# Patient Record
Sex: Male | Born: 1945 | Race: White | Hispanic: No
Health system: Midwestern US, Academic
[De-identification: ages and names within clinical notes are randomized; demographics above are authoritative.]

---

## 2017-03-27 ENCOUNTER — Encounter: Admit: 2017-03-27 | Discharge: 2017-03-27 | Payer: MEDICARE

## 2017-03-27 DIAGNOSIS — I4821 Permanent atrial fibrillation: Principal | ICD-10-CM

## 2017-05-08 ENCOUNTER — Encounter: Admit: 2017-05-08 | Discharge: 2017-05-08 | Payer: MEDICARE

## 2017-05-08 MED ORDER — LOSARTAN-HYDROCHLOROTHIAZIDE 50-12.5 MG PO TAB
ORAL_TABLET | Freq: Every day | ORAL | 0 refills | 28.00000 days | Status: AC
Start: 2017-05-08 — End: 2017-12-23

## 2017-05-08 MED ORDER — PRAVASTATIN 80 MG PO TAB
ORAL_TABLET | Freq: Every day | ORAL | 0 refills | 90.00000 days | Status: AC
Start: 2017-05-08 — End: 2017-12-23

## 2017-05-12 ENCOUNTER — Encounter: Admit: 2017-05-12 | Discharge: 2017-05-12 | Payer: MEDICARE

## 2017-05-12 NOTE — Telephone Encounter
Called and discussed overdue INR with patient.  He will have INR drawn this week.

## 2017-05-13 ENCOUNTER — Encounter: Admit: 2017-05-13 | Discharge: 2017-05-13 | Payer: MEDICARE

## 2017-05-13 DIAGNOSIS — I4821 Permanent atrial fibrillation: Principal | ICD-10-CM

## 2017-06-02 ENCOUNTER — Encounter: Admit: 2017-06-02 | Discharge: 2017-06-02 | Payer: MEDICARE

## 2017-06-02 MED ORDER — WARFARIN 5 MG PO TAB
ORAL_TABLET | Freq: Every day | ORAL | 1 refills | 90.00000 days | Status: AC
Start: 2017-06-02 — End: 2018-06-10

## 2017-06-20 ENCOUNTER — Encounter: Admit: 2017-06-20 | Discharge: 2017-06-20 | Payer: MEDICARE

## 2017-06-20 NOTE — Telephone Encounter
Spoke to pt re: overdue INR and he is agreeable to go to lab next week.    ----- Message -----  From: Asencion Noble  Sent: 06/17/2017  To: [facility] [place] [facility]  Subject: INR Due 06/10/17                                  Havlin, Louellen Molder is due for an INR test on 06/10/17.

## 2017-06-24 ENCOUNTER — Encounter: Admit: 2017-06-24 | Discharge: 2017-06-24 | Payer: MEDICARE

## 2017-06-24 DIAGNOSIS — I4821 Permanent atrial fibrillation: Principal | ICD-10-CM

## 2017-06-26 ENCOUNTER — Encounter: Admit: 2017-06-26 | Discharge: 2017-06-26 | Payer: MEDICARE

## 2017-07-24 ENCOUNTER — Encounter: Admit: 2017-07-24 | Discharge: 2017-07-24 | Payer: MEDICARE

## 2017-07-24 DIAGNOSIS — I4821 Permanent atrial fibrillation: Principal | ICD-10-CM

## 2017-08-22 ENCOUNTER — Encounter: Admit: 2017-08-22 | Discharge: 2017-08-22 | Payer: MEDICARE

## 2017-08-22 DIAGNOSIS — I4821 Permanent atrial fibrillation: Principal | ICD-10-CM

## 2017-08-27 LAB — CBC
Lab: 106 — ABNORMAL HIGH (ref 80.0–99.0)
Lab: 12
Lab: 16
Lab: 185
Lab: 34 — ABNORMAL HIGH (ref 27.0–31.0)
Lab: 4.7
Lab: 49
Lab: 5.2 — ABNORMAL HIGH (ref <100)

## 2017-08-27 LAB — COMPREHENSIVE METABOLIC PANEL: Lab: 139

## 2017-08-27 LAB — THYROID STIMULATING HORMONE-TSH: Lab: 1.2

## 2017-08-27 LAB — LIPID PROFILE: Lab: 175

## 2017-08-27 LAB — PROSTATIC SPECIFIC ANTIGEN-PSA: Lab: 0.8

## 2017-08-29 ENCOUNTER — Encounter: Admit: 2017-08-29 | Discharge: 2017-08-29 | Payer: MEDICARE

## 2017-08-29 DIAGNOSIS — I4821 Permanent atrial fibrillation: Principal | ICD-10-CM

## 2017-09-02 ENCOUNTER — Encounter: Admit: 2017-09-02 | Discharge: 2017-09-02 | Payer: MEDICARE

## 2017-09-02 DIAGNOSIS — I4821 Permanent atrial fibrillation: Principal | ICD-10-CM

## 2017-09-18 ENCOUNTER — Encounter: Admit: 2017-09-18 | Discharge: 2017-09-18 | Payer: MEDICARE

## 2017-10-13 ENCOUNTER — Encounter: Admit: 2017-10-13 | Discharge: 2017-10-13 | Payer: MEDICARE

## 2017-10-13 NOTE — Telephone Encounter
INR Overdue.  Discussed with patient who states he will have it drawn this week.  Left callback number for any questions or concerns.

## 2017-10-16 ENCOUNTER — Encounter: Admit: 2017-10-16 | Discharge: 2017-10-16 | Payer: MEDICARE

## 2017-10-16 DIAGNOSIS — I4821 Permanent atrial fibrillation: Principal | ICD-10-CM

## 2017-10-23 ENCOUNTER — Encounter: Admit: 2017-10-23 | Discharge: 2017-10-23 | Payer: MEDICARE

## 2017-10-23 DIAGNOSIS — I4821 Permanent atrial fibrillation: Principal | ICD-10-CM

## 2017-11-12 ENCOUNTER — Encounter: Admit: 2017-11-12 | Discharge: 2017-11-12 | Payer: MEDICARE

## 2017-11-26 ENCOUNTER — Encounter: Admit: 2017-11-26 | Discharge: 2017-11-26 | Payer: MEDICARE

## 2017-11-26 DIAGNOSIS — I4821 Permanent atrial fibrillation: Principal | ICD-10-CM

## 2017-11-26 LAB — PROTIME INR (PT): Lab: 1.4 mmol/L (ref 2.0–7.8)

## 2017-12-09 ENCOUNTER — Encounter: Admit: 2017-12-09 | Discharge: 2017-12-09 | Payer: MEDICARE

## 2017-12-09 DIAGNOSIS — I4821 Permanent atrial fibrillation: Principal | ICD-10-CM

## 2017-12-09 LAB — PROTIME INR (PT): Lab: 1.6

## 2017-12-15 ENCOUNTER — Encounter: Admit: 2017-12-15 | Discharge: 2017-12-15 | Payer: MEDICARE

## 2017-12-15 DIAGNOSIS — I4821 Permanent atrial fibrillation: Principal | ICD-10-CM

## 2017-12-15 LAB — PROTIME INR (PT): Lab: 3.6

## 2017-12-22 ENCOUNTER — Encounter: Admit: 2017-12-22 | Discharge: 2017-12-22 | Payer: MEDICARE

## 2017-12-22 MED ORDER — EZETIMIBE 10 MG PO TAB
ORAL_TABLET | Freq: Every day | 0 refills | Status: AC
Start: 2017-12-22 — End: 2018-03-25

## 2017-12-23 ENCOUNTER — Encounter: Admit: 2017-12-23 | Discharge: 2017-12-23 | Payer: MEDICARE

## 2017-12-23 MED ORDER — METOPROLOL TARTRATE 50 MG PO TAB
50 mg | ORAL_TABLET | Freq: Two times a day (BID) | ORAL | 3 refills | 90.00000 days | Status: AC
Start: 2017-12-23 — End: 2018-11-10

## 2017-12-23 MED ORDER — PRAVASTATIN 80 MG PO TAB
80 mg | ORAL_TABLET | Freq: Every day | ORAL | 3 refills | 90.00000 days | Status: AC
Start: 2017-12-23 — End: 2018-11-10

## 2017-12-23 MED ORDER — LOSARTAN-HYDROCHLOROTHIAZIDE 50-12.5 MG PO TAB
1 | ORAL_TABLET | Freq: Every day | ORAL | 3 refills | 28.00000 days | Status: AC
Start: 2017-12-23 — End: 2018-11-10

## 2018-01-05 ENCOUNTER — Encounter: Admit: 2018-01-05 | Discharge: 2018-01-05 | Payer: MEDICARE

## 2018-01-05 DIAGNOSIS — I4821 Permanent atrial fibrillation: Principal | ICD-10-CM

## 2018-01-05 LAB — PROTIME INR (PT): Lab: 2.4

## 2018-02-09 ENCOUNTER — Encounter: Admit: 2018-02-09 | Discharge: 2018-02-09 | Payer: MEDICARE

## 2018-02-09 DIAGNOSIS — I4821 Permanent atrial fibrillation: Principal | ICD-10-CM

## 2018-02-09 LAB — PROTIME INR (PT): Lab: 2.7 % (ref 36–45)

## 2018-03-18 ENCOUNTER — Encounter: Admit: 2018-03-18 | Discharge: 2018-03-18 | Payer: MEDICARE

## 2018-03-19 ENCOUNTER — Encounter: Admit: 2018-03-19 | Discharge: 2018-03-19 | Payer: MEDICARE

## 2018-03-19 DIAGNOSIS — I4821 Permanent atrial fibrillation: Principal | ICD-10-CM

## 2018-03-25 ENCOUNTER — Encounter: Admit: 2018-03-25 | Discharge: 2018-03-25 | Payer: MEDICARE

## 2018-03-25 MED ORDER — EZETIMIBE 10 MG PO TAB
10 mg | ORAL_TABLET | Freq: Every day | ORAL | 0 refills | Status: AC
Start: 2018-03-25 — End: 2018-06-10

## 2018-03-26 LAB — PROTIME INR (PT): Lab: 2.3

## 2018-04-22 ENCOUNTER — Encounter: Admit: 2018-04-22 | Discharge: 2018-04-22 | Payer: MEDICARE

## 2018-04-22 DIAGNOSIS — I4821 Permanent atrial fibrillation: Principal | ICD-10-CM

## 2018-04-30 ENCOUNTER — Encounter: Admit: 2018-04-30 | Discharge: 2018-04-30 | Payer: MEDICARE

## 2018-04-30 DIAGNOSIS — I4821 Permanent atrial fibrillation: Principal | ICD-10-CM

## 2018-04-30 LAB — PROTIME INR (PT): Lab: 1.9 g/dL (ref 32.0–36.0)

## 2018-05-15 ENCOUNTER — Encounter: Admit: 2018-05-15 | Discharge: 2018-05-15 | Payer: MEDICARE

## 2018-05-19 ENCOUNTER — Encounter: Admit: 2018-05-19 | Discharge: 2018-05-19 | Payer: MEDICARE

## 2018-05-19 DIAGNOSIS — I4821 Permanent atrial fibrillation: Principal | ICD-10-CM

## 2018-05-19 LAB — PROTIME INR (PT): Lab: 2.6

## 2018-05-21 ENCOUNTER — Ambulatory Visit: Admit: 2018-05-21 | Discharge: 2018-05-22 | Payer: MEDICARE

## 2018-05-21 ENCOUNTER — Encounter: Admit: 2018-05-21 | Discharge: 2018-05-21 | Payer: MEDICARE

## 2018-05-21 DIAGNOSIS — Z8249 Family history of ischemic heart disease and other diseases of the circulatory system: ICD-10-CM

## 2018-05-21 DIAGNOSIS — I10 Essential (primary) hypertension: Principal | ICD-10-CM

## 2018-05-21 DIAGNOSIS — E669 Obesity, unspecified: ICD-10-CM

## 2018-05-21 DIAGNOSIS — E785 Hyperlipidemia, unspecified: ICD-10-CM

## 2018-05-21 DIAGNOSIS — E8881 Metabolic syndrome: ICD-10-CM

## 2018-05-21 DIAGNOSIS — I4821 Permanent atrial fibrillation: ICD-10-CM

## 2018-05-21 MED ORDER — AMLODIPINE 5 MG PO TAB
5 mg | ORAL_TABLET | Freq: Every day | ORAL | 3 refills | Status: AC
Start: 2018-05-21 — End: 2019-04-05

## 2018-05-28 ENCOUNTER — Encounter: Admit: 2018-05-28 | Discharge: 2018-05-28 | Payer: MEDICARE

## 2018-05-28 DIAGNOSIS — E7800 Pure hypercholesterolemia, unspecified: Principal | ICD-10-CM

## 2018-05-28 DIAGNOSIS — I10 Essential (primary) hypertension: ICD-10-CM

## 2018-05-28 DIAGNOSIS — I4821 Permanent atrial fibrillation: ICD-10-CM

## 2018-05-28 LAB — BASIC METABOLIC PANEL
Lab: 0.9
Lab: 100
Lab: 13
Lab: 139
Lab: 100
Lab: 15 — ABNORMAL HIGH (ref 0–14)
Lab: 28
Lab: 4.4 — ABNORMAL HIGH (ref <100)
Lab: 9.6

## 2018-05-28 LAB — LIPID PROFILE
Lab: 163
Lab: 79

## 2018-05-28 LAB — ALT (SGPT): Lab: 28

## 2018-06-10 ENCOUNTER — Encounter: Admit: 2018-06-10 | Discharge: 2018-06-10 | Payer: MEDICARE

## 2018-06-10 MED ORDER — EZETIMIBE 10 MG PO TAB
ORAL_TABLET | Freq: Every day | 3 refills | Status: AC
Start: 2018-06-10 — End: 2019-06-22

## 2018-06-10 MED ORDER — WARFARIN 5 MG PO TAB
ORAL_TABLET | Freq: Every day | ORAL | 3 refills | 90.00000 days | Status: AC
Start: 2018-06-10 — End: 2018-06-18

## 2018-06-18 ENCOUNTER — Encounter: Admit: 2018-06-18 | Discharge: 2018-06-18 | Payer: MEDICARE

## 2018-06-18 DIAGNOSIS — I4821 Permanent atrial fibrillation: Principal | ICD-10-CM

## 2018-06-18 DIAGNOSIS — I480 Paroxysmal atrial fibrillation: Principal | ICD-10-CM

## 2018-06-18 LAB — PROTIME INR (PT): Lab: 3.4

## 2018-06-18 MED ORDER — WARFARIN 5 MG PO TAB
ORAL_TABLET | Freq: Every day | ORAL | 3 refills | 90.00000 days | Status: AC
Start: 2018-06-18 — End: ?

## 2018-06-30 ENCOUNTER — Ambulatory Visit: Admit: 2018-06-30 | Discharge: 2018-07-01 | Payer: MEDICARE

## 2018-06-30 ENCOUNTER — Encounter: Admit: 2018-06-30 | Discharge: 2018-06-30 | Payer: MEDICARE

## 2018-06-30 DIAGNOSIS — I4821 Permanent atrial fibrillation: Principal | ICD-10-CM

## 2018-06-30 DIAGNOSIS — I10 Essential (primary) hypertension: Principal | ICD-10-CM

## 2018-06-30 DIAGNOSIS — Z8249 Family history of ischemic heart disease and other diseases of the circulatory system: ICD-10-CM

## 2018-06-30 DIAGNOSIS — E785 Hyperlipidemia, unspecified: ICD-10-CM

## 2018-06-30 DIAGNOSIS — E669 Obesity, unspecified: ICD-10-CM

## 2018-06-30 DIAGNOSIS — E8881 Metabolic syndrome: ICD-10-CM

## 2018-06-30 DIAGNOSIS — E7800 Pure hypercholesterolemia, unspecified: ICD-10-CM

## 2018-06-30 LAB — PROTIME INR (PT): Lab: 3.5 U/L (ref 25–110)

## 2018-07-17 ENCOUNTER — Encounter: Admit: 2018-07-17 | Discharge: 2018-07-17 | Payer: MEDICARE

## 2018-07-17 DIAGNOSIS — Z7901 Long term (current) use of anticoagulants: ICD-10-CM

## 2018-07-17 DIAGNOSIS — I480 Paroxysmal atrial fibrillation: Principal | ICD-10-CM

## 2018-07-23 ENCOUNTER — Encounter: Admit: 2018-07-23 | Discharge: 2018-07-23 | Payer: MEDICARE

## 2018-07-23 DIAGNOSIS — I480 Paroxysmal atrial fibrillation: Principal | ICD-10-CM

## 2018-07-23 DIAGNOSIS — Z7901 Long term (current) use of anticoagulants: ICD-10-CM

## 2018-07-23 LAB — LIPID PROFILE
Lab: 13
Lab: 156
Lab: 3
Lab: 91 — ABNORMAL HIGH (ref 27.0–31.0)

## 2018-07-23 LAB — COMPREHENSIVE METABOLIC PANEL
Lab: 0.7
Lab: 11
Lab: 135 — ABNORMAL LOW (ref 136–145)
Lab: 26
Lab: 29
Lab: 4.2
Lab: 50
Lab: 7.5
Lab: 81

## 2018-07-23 LAB — PROTIME INR (PT): Lab: 2.3

## 2018-07-23 LAB — THYROID STIMULATING HORMONE-TSH: Lab: 1 — ABNORMAL HIGH (ref 80.0–99.0)

## 2018-07-23 LAB — CBC: Lab: 4.2 — ABNORMAL LOW (ref 4.8–10.8)

## 2018-07-23 LAB — PROSTATIC SPECIFIC ANTIGEN-PSA: Lab: 0.7

## 2018-08-20 ENCOUNTER — Encounter: Admit: 2018-08-20 | Discharge: 2018-08-20 | Payer: MEDICARE

## 2018-08-20 DIAGNOSIS — Z7901 Long term (current) use of anticoagulants: ICD-10-CM

## 2018-08-20 DIAGNOSIS — I480 Paroxysmal atrial fibrillation: Principal | ICD-10-CM

## 2018-08-20 LAB — PROTIME INR (PT): Lab: 2.2 mg/dL — ABNORMAL LOW (ref 8.5–10.6)

## 2018-08-28 ENCOUNTER — Encounter: Admit: 2018-08-28 | Discharge: 2018-08-28 | Payer: MEDICARE

## 2018-08-28 ENCOUNTER — Ambulatory Visit: Admit: 2018-08-28 | Discharge: 2018-08-28 | Payer: MEDICARE

## 2018-08-28 DIAGNOSIS — I4891 Unspecified atrial fibrillation: Principal | ICD-10-CM

## 2018-09-23 ENCOUNTER — Encounter: Admit: 2018-09-23 | Discharge: 2018-09-23 | Payer: MEDICARE

## 2018-09-23 DIAGNOSIS — I480 Paroxysmal atrial fibrillation: Principal | ICD-10-CM

## 2018-09-23 DIAGNOSIS — Z7901 Long term (current) use of anticoagulants: ICD-10-CM

## 2018-09-23 LAB — PROTIME INR (PT): Lab: 2.5 U/L (ref 25–110)

## 2018-10-22 ENCOUNTER — Encounter: Admit: 2018-10-22 | Discharge: 2018-10-22 | Payer: MEDICARE

## 2018-10-22 DIAGNOSIS — Z7901 Long term (current) use of anticoagulants: Secondary | ICD-10-CM

## 2018-10-22 DIAGNOSIS — I480 Paroxysmal atrial fibrillation: Secondary | ICD-10-CM

## 2018-10-22 LAB — PROTIME INR (PT): Lab: 3.3

## 2018-10-28 ENCOUNTER — Encounter: Admit: 2018-10-28 | Discharge: 2018-10-28 | Payer: MEDICARE

## 2018-10-28 DIAGNOSIS — Z7901 Long term (current) use of anticoagulants: Secondary | ICD-10-CM

## 2018-10-28 DIAGNOSIS — I480 Paroxysmal atrial fibrillation: Secondary | ICD-10-CM

## 2018-10-28 LAB — PROTIME INR (PT): Lab: 3.9

## 2018-10-30 ENCOUNTER — Encounter: Admit: 2018-10-30 | Discharge: 2018-10-30 | Payer: MEDICARE

## 2018-11-05 ENCOUNTER — Encounter: Admit: 2018-11-05 | Discharge: 2018-11-05 | Payer: MEDICARE

## 2018-11-05 DIAGNOSIS — I480 Paroxysmal atrial fibrillation: Secondary | ICD-10-CM

## 2018-11-05 LAB — PROTIME INR (PT): Lab: 2.5 mg/dL — ABNORMAL LOW (ref >40)

## 2018-11-09 ENCOUNTER — Encounter: Admit: 2018-11-09 | Discharge: 2018-11-09 | Payer: MEDICARE

## 2018-11-10 MED ORDER — PRAVASTATIN 80 MG PO TAB
ORAL_TABLET | Freq: Every day | ORAL | 2 refills | 90.00000 days | Status: AC
Start: 2018-11-10 — End: 2019-06-14

## 2018-11-10 MED ORDER — METOPROLOL TARTRATE 50 MG PO TAB
50 mg | ORAL_TABLET | Freq: Two times a day (BID) | ORAL | 2 refills | 90.00000 days | Status: AC
Start: 2018-11-10 — End: 2019-09-16

## 2018-11-10 MED ORDER — LOSARTAN-HYDROCHLOROTHIAZIDE 50-12.5 MG PO TAB
ORAL_TABLET | Freq: Every day | ORAL | 2 refills | 28.00000 days | Status: AC
Start: 2018-11-10 — End: 2019-09-16

## 2018-11-12 ENCOUNTER — Encounter: Admit: 2018-11-12 | Discharge: 2018-11-12 | Payer: MEDICARE

## 2018-11-12 ENCOUNTER — Ambulatory Visit: Admit: 2018-11-12 | Discharge: 2018-11-12 | Payer: MEDICARE

## 2018-11-12 DIAGNOSIS — I10 Essential (primary) hypertension: Secondary | ICD-10-CM

## 2018-11-12 DIAGNOSIS — E669 Obesity, unspecified: Secondary | ICD-10-CM

## 2018-11-12 DIAGNOSIS — I4891 Unspecified atrial fibrillation: Secondary | ICD-10-CM

## 2018-11-12 DIAGNOSIS — E785 Hyperlipidemia, unspecified: Secondary | ICD-10-CM

## 2018-11-12 DIAGNOSIS — Z8249 Family history of ischemic heart disease and other diseases of the circulatory system: Secondary | ICD-10-CM

## 2018-11-12 DIAGNOSIS — E8881 Metabolic syndrome: Secondary | ICD-10-CM

## 2018-11-19 ENCOUNTER — Encounter: Admit: 2018-11-19 | Discharge: 2018-11-19 | Payer: MEDICARE

## 2018-11-19 DIAGNOSIS — I4891 Unspecified atrial fibrillation: Principal | ICD-10-CM

## 2018-11-19 DIAGNOSIS — I480 Paroxysmal atrial fibrillation: Principal | ICD-10-CM

## 2018-11-19 DIAGNOSIS — Z7901 Long term (current) use of anticoagulants: ICD-10-CM

## 2018-11-19 LAB — PROTIME INR (PT): Lab: 2.8 10*3/uL — ABNORMAL HIGH (ref 3–12)

## 2018-12-23 ENCOUNTER — Encounter: Admit: 2018-12-23 | Discharge: 2018-12-23 | Payer: MEDICARE

## 2018-12-23 DIAGNOSIS — I480 Paroxysmal atrial fibrillation: Principal | ICD-10-CM

## 2018-12-23 DIAGNOSIS — Z7901 Long term (current) use of anticoagulants: ICD-10-CM

## 2018-12-23 DIAGNOSIS — I4891 Unspecified atrial fibrillation: Principal | ICD-10-CM

## 2018-12-23 LAB — PROTIME INR (PT): Lab: 2.7

## 2019-02-12 ENCOUNTER — Encounter: Admit: 2019-02-12 | Discharge: 2019-02-12 | Payer: MEDICARE

## 2019-02-16 ENCOUNTER — Encounter: Admit: 2019-02-16 | Discharge: 2019-02-16 | Payer: MEDICARE

## 2019-02-16 LAB — PROTIME INR (PT): Lab: 2.5

## 2019-03-25 ENCOUNTER — Encounter: Admit: 2019-03-25 | Discharge: 2019-03-25

## 2019-03-25 DIAGNOSIS — I4891 Unspecified atrial fibrillation: Secondary | ICD-10-CM

## 2019-03-25 DIAGNOSIS — Z7901 Long term (current) use of anticoagulants: Secondary | ICD-10-CM

## 2019-03-25 DIAGNOSIS — I480 Paroxysmal atrial fibrillation: Secondary | ICD-10-CM

## 2019-03-25 LAB — PROTIME INR (PT): Lab: 3.1

## 2019-03-25 NOTE — Progress Notes
Spoke to pt re: INR results. Verified current Coumadin dose. Pt states he hasn't eaten as many greens as usual this week, but is agreeable to add more back in his diet. He has been on same dose for some time. Will plan to continue current dose and recheck in ~ 1 mo. Pt verbalized understanding and was agreeable to this plan. No further needs identified at this time.

## 2019-03-28 ENCOUNTER — Encounter: Admit: 2019-03-28 | Discharge: 2019-03-28

## 2019-03-29 MED ORDER — WARFARIN 2 MG PO TAB
ORAL_TABLET | Freq: Every day | ORAL | 0 refills | 90.00000 days | Status: DC
Start: 2019-03-29 — End: 2019-06-22

## 2019-04-04 ENCOUNTER — Encounter: Admit: 2019-04-04 | Discharge: 2019-04-04

## 2019-04-05 MED ORDER — AMLODIPINE 5 MG PO TAB
ORAL_TABLET | Freq: Every day | 2 refills | Status: DC
Start: 2019-04-05 — End: 2019-12-27

## 2019-04-09 ENCOUNTER — Encounter: Admit: 2019-04-09 | Discharge: 2019-04-09

## 2019-04-27 ENCOUNTER — Encounter: Admit: 2019-04-27 | Discharge: 2019-04-27

## 2019-04-27 DIAGNOSIS — I4891 Unspecified atrial fibrillation: Secondary | ICD-10-CM

## 2019-05-11 ENCOUNTER — Encounter: Admit: 2019-05-11 | Discharge: 2019-05-11

## 2019-05-25 ENCOUNTER — Encounter: Admit: 2019-05-25 | Discharge: 2019-05-25

## 2019-05-25 DIAGNOSIS — Z7901 Long term (current) use of anticoagulants: Secondary | ICD-10-CM

## 2019-05-25 DIAGNOSIS — I480 Paroxysmal atrial fibrillation: Secondary | ICD-10-CM

## 2019-05-25 LAB — PROTIME INR (PT): Lab: 2.4

## 2019-06-09 ENCOUNTER — Encounter: Admit: 2019-06-09 | Discharge: 2019-06-09

## 2019-06-09 DIAGNOSIS — I480 Paroxysmal atrial fibrillation: Secondary | ICD-10-CM

## 2019-06-09 DIAGNOSIS — Z7901 Long term (current) use of anticoagulants: Secondary | ICD-10-CM

## 2019-06-09 LAB — PROTIME INR (PT): Lab: 2.3

## 2019-06-14 ENCOUNTER — Encounter: Admit: 2019-06-14 | Discharge: 2019-06-14

## 2019-06-14 MED ORDER — PRAVASTATIN 80 MG PO TAB
80 mg | ORAL_TABLET | Freq: Every day | ORAL | 0 refills | 90.00000 days | Status: DC
Start: 2019-06-14 — End: 2019-06-16

## 2019-06-16 ENCOUNTER — Encounter: Admit: 2019-06-16 | Discharge: 2019-06-16

## 2019-06-16 MED ORDER — PRAVASTATIN 80 MG PO TAB
80 mg | ORAL_TABLET | Freq: Every day | ORAL | 1 refills | 90.00000 days | Status: DC
Start: 2019-06-16 — End: 2019-06-22

## 2019-06-17 ENCOUNTER — Ambulatory Visit: Admit: 2019-06-17 | Discharge: 2019-06-18

## 2019-06-17 ENCOUNTER — Encounter: Admit: 2019-06-17 | Discharge: 2019-06-17

## 2019-06-17 DIAGNOSIS — Z8249 Family history of ischemic heart disease and other diseases of the circulatory system: Secondary | ICD-10-CM

## 2019-06-17 DIAGNOSIS — E785 Hyperlipidemia, unspecified: Secondary | ICD-10-CM

## 2019-06-17 DIAGNOSIS — E669 Obesity, unspecified: Secondary | ICD-10-CM

## 2019-06-17 DIAGNOSIS — E8881 Metabolic syndrome: Secondary | ICD-10-CM

## 2019-06-17 DIAGNOSIS — I10 Essential (primary) hypertension: Secondary | ICD-10-CM

## 2019-06-17 IMAGING — US ECHOCOMPL
1 series · 14 of 24 positions shown · non-contrast
Comparison: none

[Series 1: us echo 2d, wo/w m-mode, compl · 96 acquisitions, 14 frames shown]
[im 1/96]
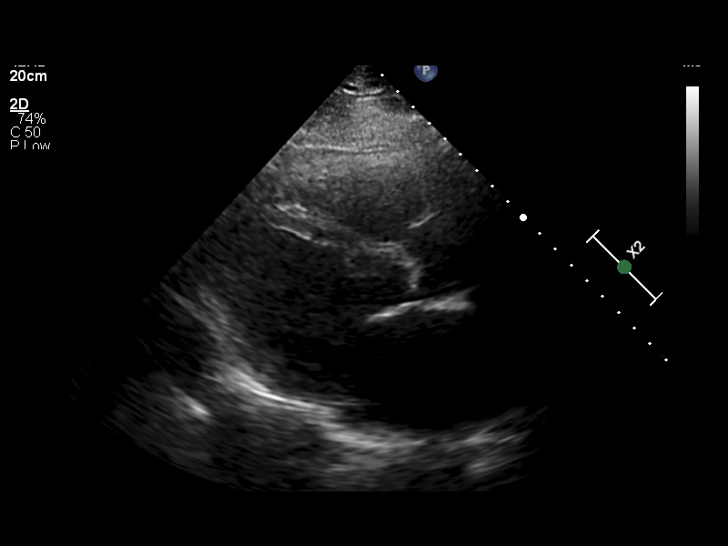
[im 9/96]
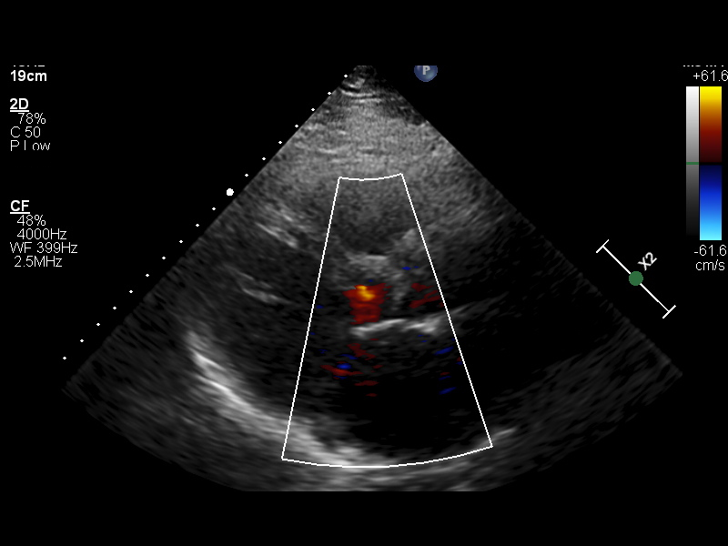
[im 17/96]
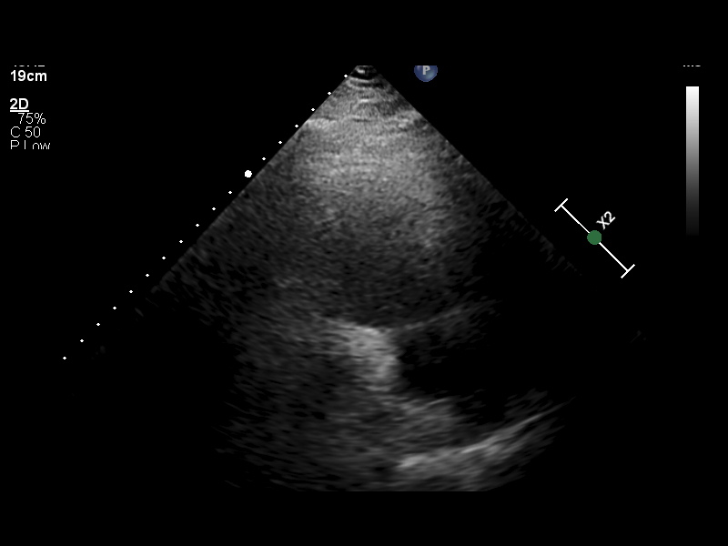
[im 21/96]
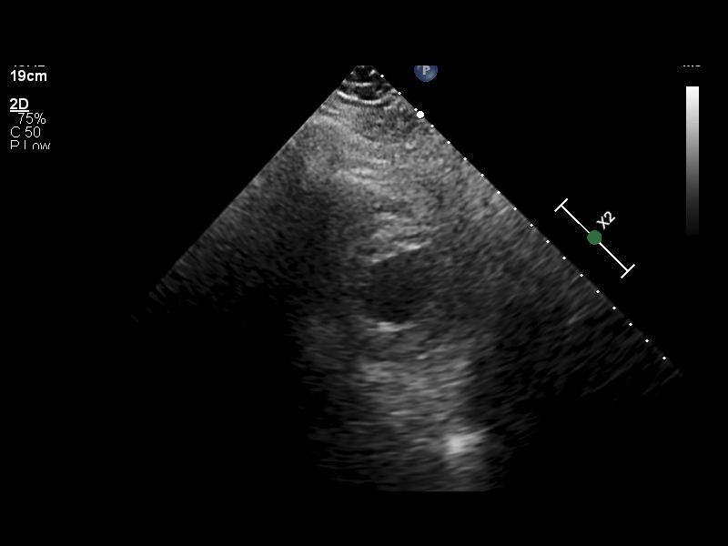
[im 25/96]
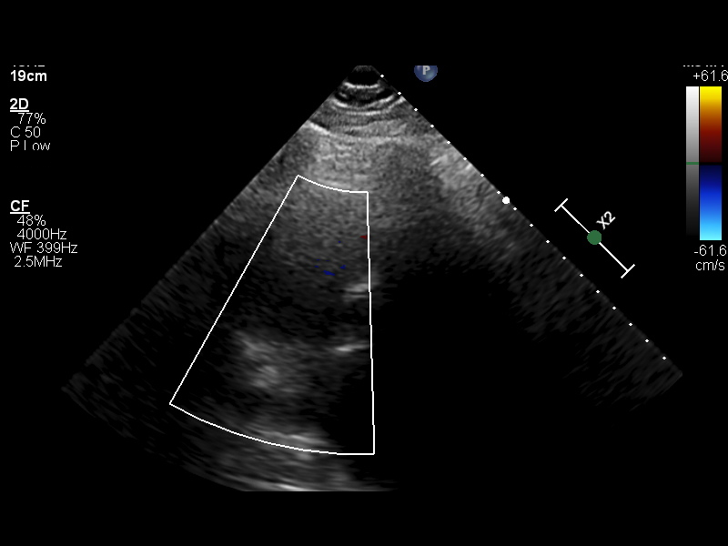
[im 34/96]
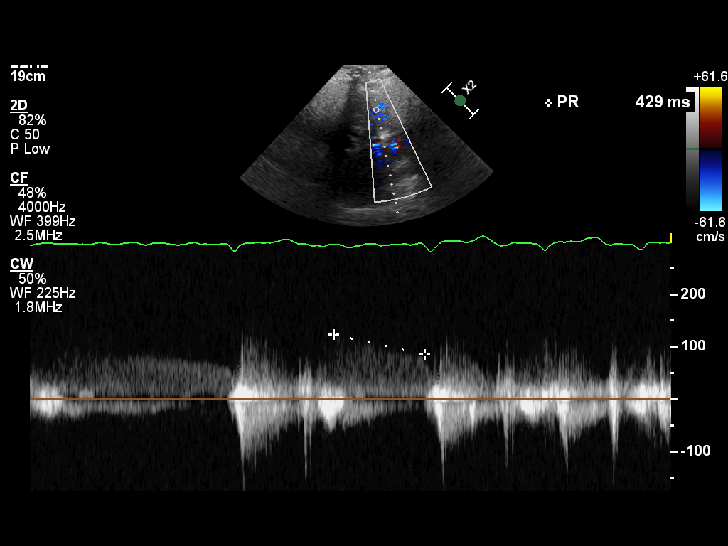
[im 42/96]
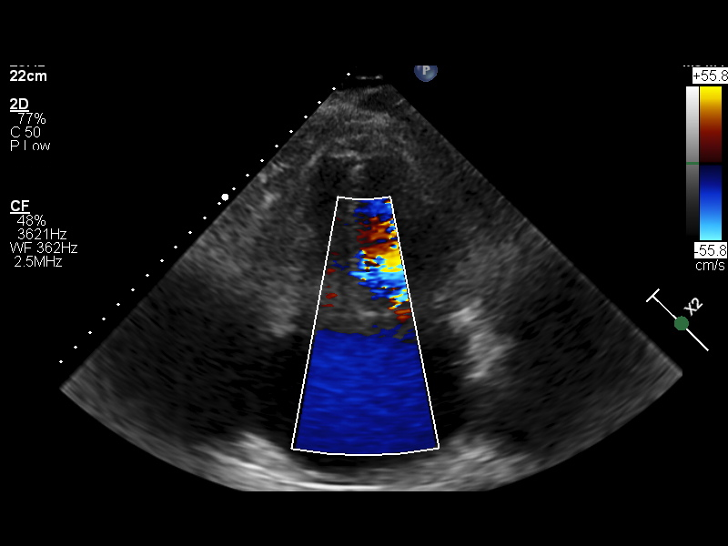
[im 50/96]
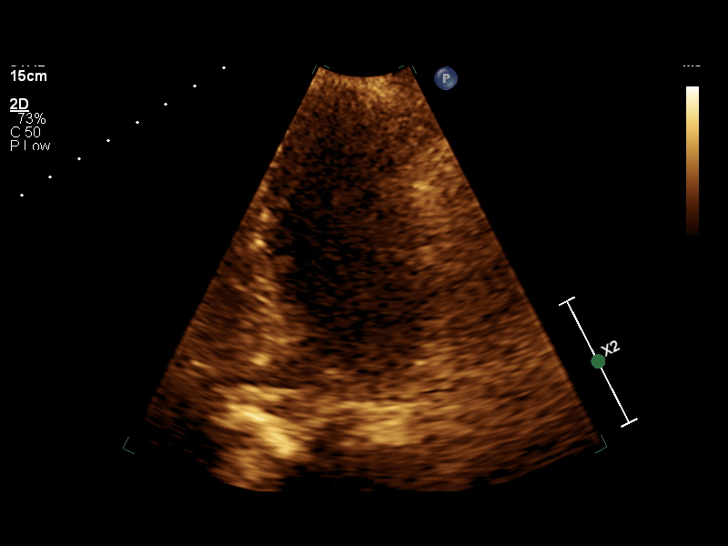
[im 54/96]
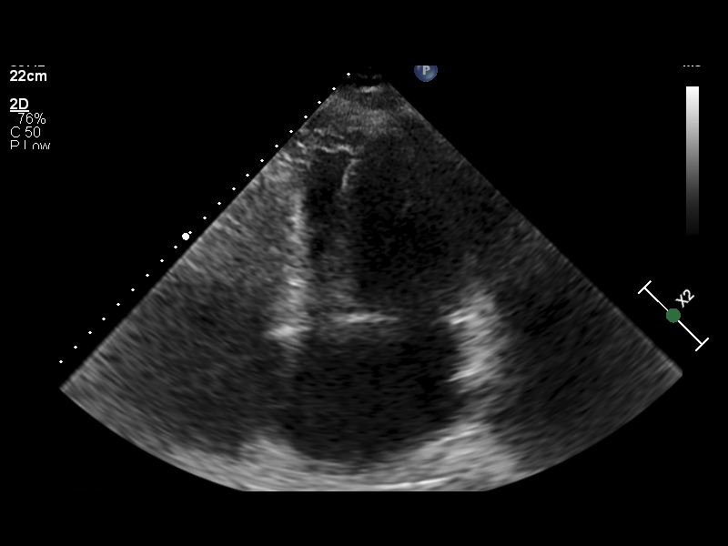
[im 62/96]
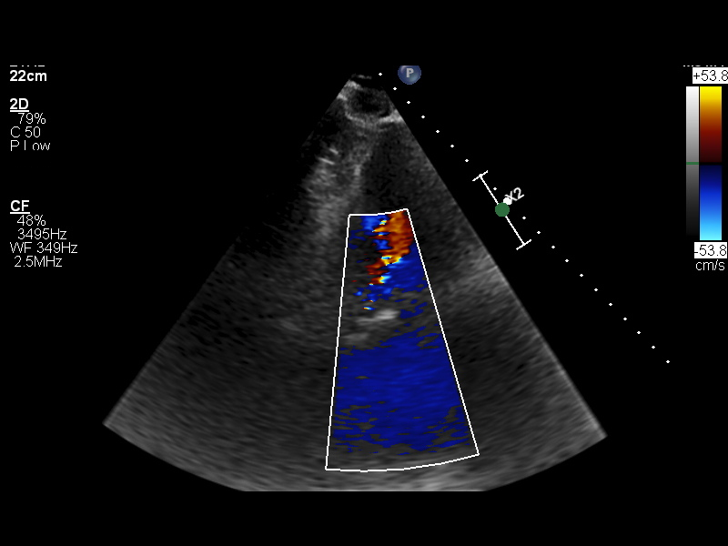
[im 71/96]
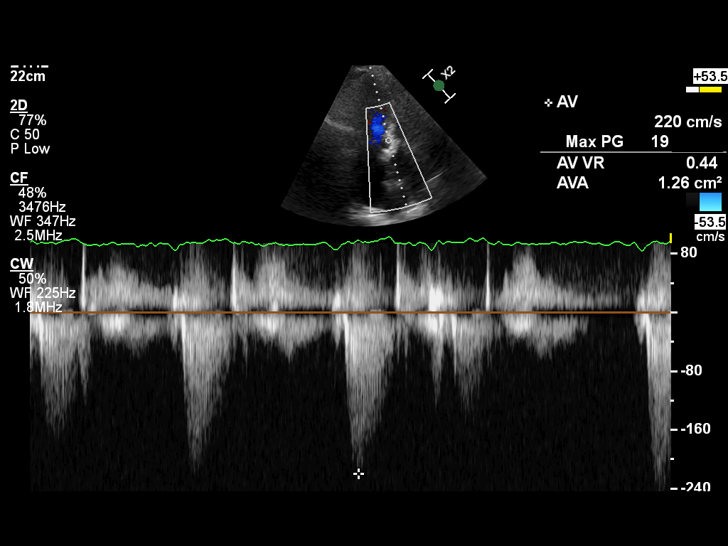
[im 79/96]
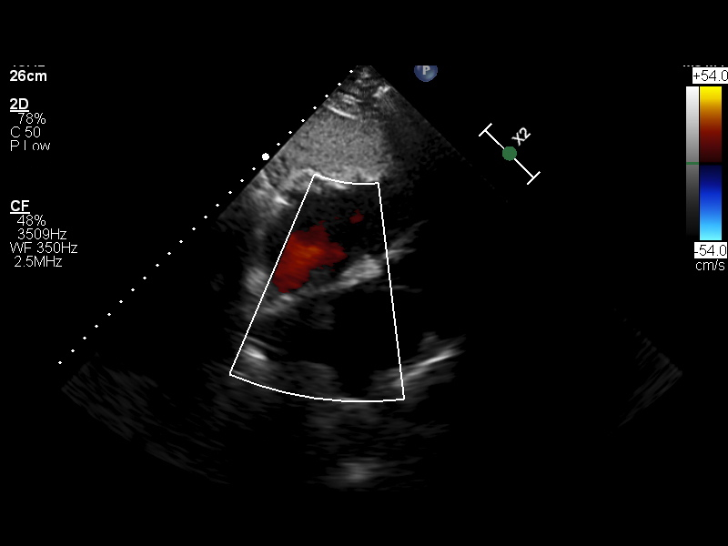
[im 83/96]
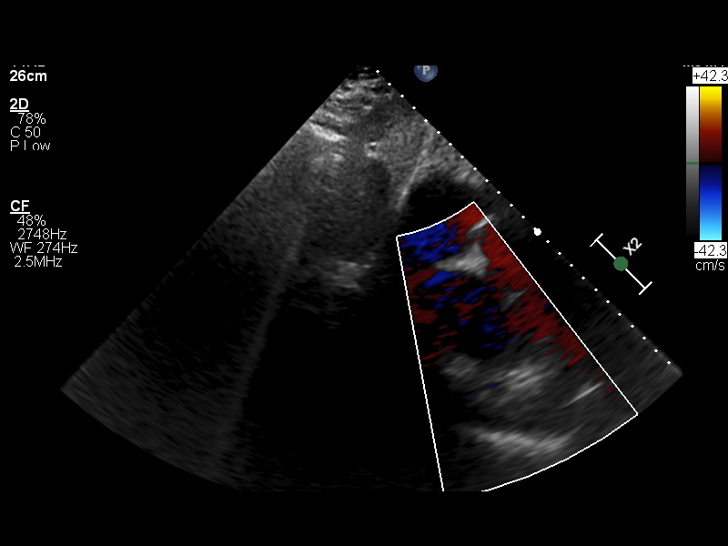
[im 96/96]
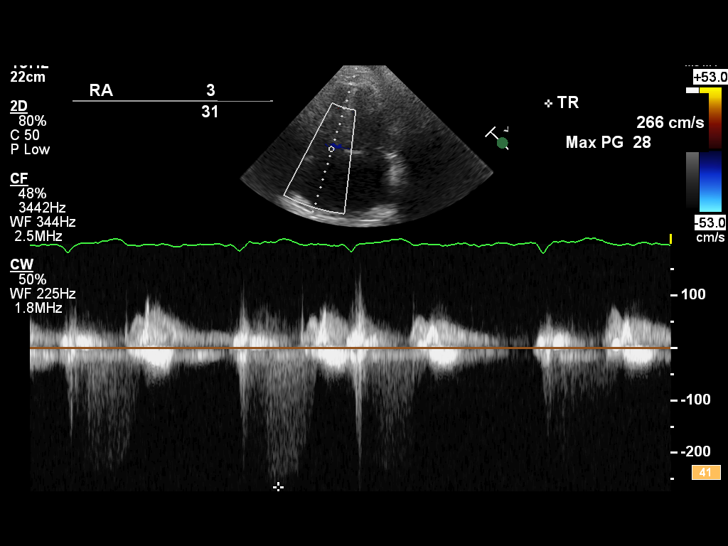

[14 of 24 positions shown; findings below may reference images not displayed]

FINAL REPORT IS SCANNED IN THE PATIENT'S EMR.

Tech Notes:

jl

## 2019-06-17 NOTE — Progress Notes
Date of Service: 06/17/2019    Jerry Price is a 73 y.o. male.       HPI     [name] has been followed for???permanent???atrial fibrillation. hypertension, hyperlipidemia, and a family history of coronary artery disease.??????  He reports that his blood pressure is been under good control.  He continues to use his elliptical exercise machine, exercising 10 minutes/day. ???He states that he would exercise more but he has some right knee discomfort.  His weight is back up 5  pounds since last clinic visit. [name]???has indicated his preference for a strategy of heart rate control and anticoagulation???and has declined cardioversion, antiarrhythmic therapy and cardiac arrhythmia ablation.??????Otherwise, he has been stable from a cardiovascular perspective and reports no angina, congestive symptoms, palpitations, sensation of sustained forceful heart pounding, lightheadedness or syncope. His exercise tolerance has been stable. 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 The patient reports no myalgias, bleeding abnormalities, neurologic motor abnormalities or difficulty with speech. He reports no recent emergency room visits or hospitalizations. He tolerates???pravastatin but has not tolerated other statins because of myalgias.   Historically, in January 2013 [name] developed paroxysmal atrial fibrillation. He was placed on Pradaxa and on 10/30/2011 he underwent TEE-guided cardioversion with excellent results. In August 2013 he switched from Pradaxa to warfarin because of affordability. He developed recurrence of atrial fibrillation and on 07/09/12 underwent repeat cardioversion and was placed on dronedarone for suppression. In October 2015 he was taken off dronedarone when he was found to be in atrial fibrillation and indicated that he did not want to continue antiarrhythmic therapy or pursue cardioversion or arrhythmia ablation.[name] reports that a skin cancer was removed from his right upper arm in January 2017.???         Vitals:    06/17/19 1059 06/17/19 1104   BP: 134/86 128/84   BP Source: Arm, Left Upper Arm, Right Upper   Pulse: 68    Temp: 36.6 ???C (97.8 ???F)    SpO2: 98%    Weight: 110.2 kg (243 lb)    Height: 1.727 m (5' 8)    PainSc: Zero      Body mass index is 36.95 kg/m???.     Past Medical History  Patient Active Problem List    Diagnosis Date Noted   ??? Heart palpitations 10/23/2011   ??? Atrial fibrillation (HCC) 10/23/2011   ??? Family history of coronary arteriosclerosis 03/22/2009   ??? Observation for suspected coronary artery disease (CAD) 03/22/2009     Cardiac surveillance testing due to chest pain       a.  10/97 - Treadmill done @ [facility]: nondiagnostic ST segment changes,            non-ischemic study     ??? Chest pain 03/22/2009   ??? Hypertension 03/22/2009     Hypertension - diagnosed in 1997     ??? Hyperlipemia 03/22/2009     Mixed Hyperlipidemia-(Elev. Trigs & LDL, Low HDL)       a. Homocystinemia, normal LPa       b. Elevated hsCRP     ??? History of tobacco use 03/22/2009     History of tobacco use: Quit smoking 10-12 years ago.  Hx: 3 packs/day for 20 years.     ??? 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 Metabolic syndrome X 03/22/2009   ??? Gout 03/22/2009  Review of Systems   Constitution: Negative.   HENT: Negative.    Eyes: Negative.    Cardiovascular: Positive for dyspnea on exertion.   Respiratory: Negative.    Endocrine: Negative.    Hematologic/Lymphatic: Negative.    Skin: Negative.    Musculoskeletal: Negative.    Gastrointestinal: Negative.    Genitourinary: Negative.    Neurological: Negative.    Psychiatric/Behavioral: Negative.    Allergic/Immunologic: Negative.        Physical Exam  GENERAL: The patient is well developed, well nourished, resting comfortably and in no distress. ???  HEENT: No abnormalities of the visible oro-nasopharynx, conjunctiva or sclera are noted. ???  NECK: There is no jugular venous distension. Carotids are palpable and without bruits. There is no thyroid enlargement. ???  Chest: Lung fields are clear to auscultation. There are no wheezes or crackles. ???  CV: There is an irregular rhythm with an apical rate of???72???BPM???and variable intensity of the first heart sound.. There are no murmurs, gallops or rubs. ???  ABD: The abdomen is soft and supple with normal bowel sounds. There is no hepatosplenomegaly, ascites, tenderness, masses or bruits. ???  Neuro: There are no focal motor defects. Ambulation is normal. Cognitive function appears normal. ???  Ext:???There is trace to 1+ bipedal???edema???without???evidence of deep vein thrombosis. Peripheral pulses are satisfactory. ???  SKIN:???There are no rashes and no cellulitis  PSYCH:???The patient is calm, rationale and oriented    Cardiovascular Studies  A 12-lead ECG was obtained on 06/17/2019 reveals atrial fibrillation with a heart rate of 71 bpm.  There is no evidence of myocardial infarction or ischemia.    Labs from July 23, 2018 reveal serum potassium 4.4 mmol/L and serum creatinine 0.96 mg/dL.  His ALT is 26.  Total cholesterol 156, triglycerides 64, HDL 55 and LDL cholesterol 91 mg/dL.  His TSH is normal at 1.09 microunits/mL.Problems Addressed Today  Permanent atrial fibrillation.  Hypertension.  Hypercholesterolemia.  Assessment and Plan     Jerry Price???pressure currently appears well controlled on his current medical regimen. ??? Regular???mild???aerobic exercise, weight loss and adherence to a heart healthy diet were recommended.???Currently his heart rate appears well controlled. ???I have asked the patient to keep a log book of his BP readings and pulse readings for further review. ???He did not want to change to a stronger statin because of difficulty with myalgias experienced with other statin medications. Jerry Price CHA2DS2-VASc score is 2 for age and hypertension. The risks and benefits of anticoagulation therapy have been reviewed with the patient. The patient understands that anticoagulation is used to decrease thrombotic or clotting complications associated with atrial fibrillation/flutter, such as stroke and systemic embolization which can be disabling or fatal, but can, on occasion, lead to life-threatening bleeding complications including gastrointestinal and intracranial hemorrhage. The patient wishes to continue with anticoagulation. Anticoagulation options were presented to the patient which included warfarin or one of the newer direct acting oral anticoagulants and the patient wanted to continue taking warfarin. ???I once again talked to Jerry Price???about rhythm management for his atrial fibrillation and discussed cardioversion, antiarrhythmic therapy and arrhythmia ablation. ???He did not want to pursue this form of treatment and wanted to continue with heart rate control and anticoagulation. ???I have asked [name] to continue working with the cardiovascular nurses so that his INR can be monitored and his dose of warfarin adjusted accordingly.   He was given a requisition to obtain a Chem-7. ???I have asked him to return for follow-up in???6???months.  Current Medications (including today's revisions)  Current Medications (including today's revisions)  ??? amLODIPine (NORVASC) 5 mg tablet TAKE 1 TABLET EVERY DAY   ??? ezetimibe (ZETIA) 10 mg tablet TAKE 1 TABLET BY MOUTH ONCE DAILY MUST  MAKE  OFFICE  VISIT  FOR  ANY  ADDITIONAL  REFILLS   ??? GREEN TEA EXTRACT PO Take 1 Tab by mouth Daily.   ??? losartan-hydrochlorothiazide (HYZAAR) 50-12.5 mg tablet TAKE 1 TABLET EVERY DAY   ??? metoprolol tartrate (LOPRESSOR) 50 mg tablet TAKE ONE TABLET BY MOUTH TWICE DAILY.   ??? multivitamin (MULTIPLE VITAMIN) per tablet Take 1 Tab by mouth Daily.   ??? pravastatin (PRAVACHOL) 80 mg tablet Take one tablet by mouth daily.   ??? VIT B COMPLEX NO.10/FOLIC ACID (B COMPLEX 100 CR PO) Take 1 Tab by mouth Daily. ??? warfarin (COUMADIN) 2 mg tablet TAKE ONE TABLET BY MOUTH ONCE DAILY OR  AS  DIRECTED  BY  CARDIOLOGIST   ??? warfarin (COUMADIN) 5 mg tablet Take 1/2 to 1 (one half to one) tablet by mouth once daily as directed by cardiologist.

## 2019-06-20 ENCOUNTER — Encounter: Admit: 2019-06-20 | Discharge: 2019-06-20

## 2019-06-22 ENCOUNTER — Encounter: Admit: 2019-06-22 | Discharge: 2019-06-22

## 2019-06-22 MED ORDER — PRAVASTATIN 80 MG PO TAB
80 mg | ORAL_TABLET | Freq: Every day | ORAL | 3 refills | 90.00000 days | Status: AC
Start: 2019-06-22 — End: ?

## 2019-06-22 MED ORDER — EZETIMIBE 10 MG PO TAB
ORAL_TABLET | Freq: Every day | 3 refills | Status: AC
Start: 2019-06-22 — End: ?

## 2019-06-22 MED ORDER — WARFARIN 2 MG PO TAB
ORAL_TABLET | Freq: Every day | ORAL | 3 refills | 90.00000 days | Status: AC
Start: 2019-06-22 — End: ?

## 2019-07-09 ENCOUNTER — Encounter: Admit: 2019-07-09 | Discharge: 2019-07-09 | Payer: MEDICARE

## 2019-07-09 LAB — BASIC METABOLIC PANEL
Lab: 0.9
Lab: 100
Lab: 120 — ABNORMAL HIGH (ref 70–105)
Lab: 13
Lab: 137
Lab: 27
Lab: 4
Lab: 9.3

## 2019-07-09 LAB — PROTIME INR (PT): Lab: 2.6

## 2019-07-09 NOTE — Telephone Encounter
-----   Message from Nehemiah Massed, MD sent at 07/09/2019  3:10 PM CDT -----  To all: His blood sugar is a little high.  Please let him know and forward to his primary care physician.  Thanks.  SBG  ----- Message -----  From: Baldwin Crown, RN  Sent: 07/09/2019   1:18 PM CDT  To: Nehemiah Massed, MD    Labs for your review.  Please let me know your recommendations.  Thank you!

## 2019-07-09 NOTE — Telephone Encounter
Faxed lab to Dr. Tobey Grim office for review.  Discussed labs with patient.  No further needs identified.  Will callback with any questions, concerns or problems.

## 2019-07-09 NOTE — Telephone Encounter
Results and recommendations called to patient.

## 2019-07-09 NOTE — Telephone Encounter
-----   Message from Steven B Gollub, MD sent at 07/09/2019  3:10 PM CDT -----  To all: His blood sugar is a little high.  Please let him know and forward to his primary care physician.  Thanks.  SBG  ----- Message -----  From: Sheika Coutts, RN  Sent: 07/09/2019   1:18 PM CDT  To: Steven B Gollub, MD    Labs for your review.  Please let me know your recommendations.  Thank you!

## 2019-08-19 ENCOUNTER — Encounter: Admit: 2019-08-19 | Discharge: 2019-08-19 | Payer: MEDICARE

## 2019-08-19 DIAGNOSIS — I480 Paroxysmal atrial fibrillation: Secondary | ICD-10-CM

## 2019-08-19 DIAGNOSIS — Z7901 Long term (current) use of anticoagulants: Secondary | ICD-10-CM

## 2019-08-19 LAB — PROTIME INR (PT): Lab: 2.1

## 2019-08-24 ENCOUNTER — Encounter: Admit: 2019-08-24 | Discharge: 2019-08-24 | Payer: MEDICARE

## 2019-08-24 DIAGNOSIS — I4891 Unspecified atrial fibrillation: Secondary | ICD-10-CM

## 2019-09-16 ENCOUNTER — Encounter: Admit: 2019-09-16 | Discharge: 2019-09-16 | Payer: MEDICARE

## 2019-09-16 MED ORDER — METOPROLOL TARTRATE 50 MG PO TAB
ORAL_TABLET | Freq: Two times a day (BID) | ORAL | 3 refills | 90.00000 days | Status: AC
Start: 2019-09-16 — End: ?

## 2019-09-16 MED ORDER — LOSARTAN-HYDROCHLOROTHIAZIDE 50-12.5 MG PO TAB
ORAL_TABLET | Freq: Every day | ORAL | 3 refills | 28.00000 days | Status: AC
Start: 2019-09-16 — End: ?

## 2019-09-30 ENCOUNTER — Encounter: Admit: 2019-09-30 | Discharge: 2019-09-30 | Payer: MEDICARE

## 2019-09-30 DIAGNOSIS — I4891 Unspecified atrial fibrillation: Secondary | ICD-10-CM

## 2019-09-30 LAB — PROTIME INR (PT): Lab: 3.4

## 2019-10-06 ENCOUNTER — Encounter: Admit: 2019-10-06 | Discharge: 2019-10-06 | Payer: MEDICARE

## 2019-10-06 DIAGNOSIS — I480 Paroxysmal atrial fibrillation: Secondary | ICD-10-CM

## 2019-10-06 DIAGNOSIS — Z7901 Long term (current) use of anticoagulants: Secondary | ICD-10-CM

## 2019-10-06 LAB — PROTIME INR (PT): Lab: 2.6

## 2019-10-13 ENCOUNTER — Encounter: Admit: 2019-10-13 | Discharge: 2019-10-13 | Payer: MEDICARE

## 2019-10-13 DIAGNOSIS — I4891 Unspecified atrial fibrillation: Secondary | ICD-10-CM

## 2019-10-13 DIAGNOSIS — I480 Paroxysmal atrial fibrillation: Secondary | ICD-10-CM

## 2019-10-13 DIAGNOSIS — Z7901 Long term (current) use of anticoagulants: Secondary | ICD-10-CM

## 2019-10-13 LAB — PROTIME INR (PT): Lab: 2.4

## 2019-11-10 ENCOUNTER — Encounter: Admit: 2019-11-10 | Discharge: 2019-11-10 | Payer: MEDICARE

## 2019-11-10 DIAGNOSIS — Z7901 Long term (current) use of anticoagulants: Secondary | ICD-10-CM

## 2019-11-10 DIAGNOSIS — I480 Paroxysmal atrial fibrillation: Secondary | ICD-10-CM

## 2019-11-10 LAB — PROTIME INR (PT): Lab: 3.3 MMOL/L (ref 3.5–5.1)

## 2019-11-17 ENCOUNTER — Encounter: Admit: 2019-11-17 | Discharge: 2019-11-17 | Payer: MEDICARE

## 2019-11-17 DIAGNOSIS — I480 Paroxysmal atrial fibrillation: Secondary | ICD-10-CM

## 2019-11-17 DIAGNOSIS — Z7901 Long term (current) use of anticoagulants: Secondary | ICD-10-CM

## 2019-11-17 LAB — PROTIME INR (PT): Lab: 3 MMOL/L (ref 21–30)

## 2019-11-25 ENCOUNTER — Encounter: Admit: 2019-11-25 | Discharge: 2019-11-25 | Payer: MEDICARE

## 2019-11-25 DIAGNOSIS — I480 Paroxysmal atrial fibrillation: Secondary | ICD-10-CM

## 2019-11-25 DIAGNOSIS — Z7901 Long term (current) use of anticoagulants: Secondary | ICD-10-CM

## 2019-11-25 LAB — PROTIME INR (PT): Lab: 2.2

## 2019-12-03 ENCOUNTER — Encounter: Admit: 2019-12-03 | Discharge: 2019-12-03 | Payer: MEDICARE

## 2019-12-03 DIAGNOSIS — Z7901 Long term (current) use of anticoagulants: Secondary | ICD-10-CM

## 2019-12-03 DIAGNOSIS — I480 Paroxysmal atrial fibrillation: Secondary | ICD-10-CM

## 2019-12-03 LAB — PROTIME INR (PT): Lab: 2.5

## 2019-12-25 ENCOUNTER — Encounter: Admit: 2019-12-25 | Discharge: 2019-12-25 | Payer: MEDICARE

## 2019-12-27 MED ORDER — AMLODIPINE 5 MG PO TAB
ORAL_TABLET | Freq: Every day | 2 refills | Status: AC
Start: 2019-12-27 — End: ?

## 2020-01-04 ENCOUNTER — Encounter: Admit: 2020-01-04 | Discharge: 2020-01-04 | Payer: MEDICARE

## 2020-01-04 DIAGNOSIS — I480 Paroxysmal atrial fibrillation: Secondary | ICD-10-CM

## 2020-01-04 DIAGNOSIS — I4891 Unspecified atrial fibrillation: Secondary | ICD-10-CM

## 2020-01-04 DIAGNOSIS — Z7901 Long term (current) use of anticoagulants: Secondary | ICD-10-CM

## 2020-01-04 LAB — PROTIME INR (PT): Lab: 3.4 mg/dL — ABNORMAL HIGH (ref 8–26)

## 2020-01-11 ENCOUNTER — Encounter: Admit: 2020-01-11 | Discharge: 2020-01-11 | Payer: MEDICARE

## 2020-01-11 DIAGNOSIS — I480 Paroxysmal atrial fibrillation: Secondary | ICD-10-CM

## 2020-01-11 DIAGNOSIS — Z7901 Long term (current) use of anticoagulants: Secondary | ICD-10-CM

## 2020-01-11 LAB — PROTIME INR (PT): Lab: 2.5

## 2020-01-13 ENCOUNTER — Encounter: Admit: 2020-01-13 | Discharge: 2020-01-13 | Payer: MEDICARE

## 2020-01-13 DIAGNOSIS — E669 Obesity, unspecified: Secondary | ICD-10-CM

## 2020-01-13 DIAGNOSIS — E8881 Metabolic syndrome: Secondary | ICD-10-CM

## 2020-01-13 DIAGNOSIS — E785 Hyperlipidemia, unspecified: Secondary | ICD-10-CM

## 2020-01-13 DIAGNOSIS — Z8249 Family history of ischemic heart disease and other diseases of the circulatory system: Secondary | ICD-10-CM

## 2020-01-13 DIAGNOSIS — I10 Essential (primary) hypertension: Secondary | ICD-10-CM

## 2020-01-13 DIAGNOSIS — I4821 Permanent atrial fibrillation: Secondary | ICD-10-CM

## 2020-01-13 NOTE — Progress Notes
Date of Service: 01/13/2020    Jerry Price is a 74 y.o. male.       HPI    [name] has been followed for?permanent?atrial fibrillation. hypertension, hyperlipidemia, and a family history of coronary artery disease.? He reports no febrile or infectious symptoms during the Covid pandemic is received the first of his Moderna Covid vaccines.?He reports that his blood pressure is been under good control.  His weight has been stable over the past 6 months but is up 5 pounds compared to a year ago.  [name] used to exercise on his elliptical exercise machine for 10 minutes daily but has not been doing that recently. ? [name] indicated his preference for a strategy of heart rate control and anticoagulation?and has declined cardioversion, antiarrhythmic therapy and cardiac arrhythmia ablation.??Otherwise, he has been stable from a cardiovascular perspective and reports no angina, congestive symptoms, palpitations, sensation of sustained forceful heart pounding, lightheadedness or syncope. His exercise tolerance has been stable. 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 The patient reports no myalgias, bleeding abnormalities, or strokelike symptoms. He reports no recent emergency room visits or hospitalizations. He tolerates?pravastatin but has not tolerated other statins because of myalgias.   Historically, in January 2013 [name] developed paroxysmal atrial fibrillation. He was placed on Pradaxa and on 10/30/2011 he underwent TEE-guided cardioversion with excellent results. In August 2013 he switched from Pradaxa to warfarin because of affordability. He developed recurrence of atrial fibrillation and on 07/09/12 underwent repeat cardioversion and was placed on dronedarone for suppression. In October 2015 he was taken off dronedarone when he was found to be in atrial fibrillation and indicated that he did not want to continue antiarrhythmic therapy or pursue cardioversion or arrhythmia ablation.[name] reports that a skin cancer was removed from his right upper arm in January 2017.?       Vitals:    01/13/20 1349   BP: 126/78   BP Source: Arm, Left Upper   Patient Position: Sitting   Pulse: 54   SpO2: 98%   Weight: 110.2 kg (243 lb)   Height: 1.727 m (5' 8)   PainSc: Zero     Body mass index is 36.95 kg/m?Marland Kitchen     Past Medical History  Patient Active Problem List    Diagnosis Date Noted   ? Heart palpitations 10/23/2011   ? Atrial fibrillation (HCC) 10/23/2011   ? Family history of coronary arteriosclerosis 03/22/2009   ? Observation for suspected coronary artery disease (CAD) 03/22/2009     Cardiac surveillance testing due to chest pain       a.  10/97 - Treadmill done @ [facility]: nondiagnostic ST segment changes,            non-ischemic study     ? Chest pain 03/22/2009   ? Hypertension 03/22/2009     Hypertension - diagnosed in 1997     ? Hyperlipemia 03/22/2009     Mixed Hyperlipidemia-(Elev. Trigs & LDL, Low HDL)       a. Homocystinemia, normal LPa       b. Elevated hsCRP     ? History of tobacco use 03/22/2009     History of tobacco use: Quit smoking 10-12 years ago.  Hx: 3 packs/day for 20 years.     ? 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 Metabolic  syndrome X 03/22/2009   ? Gout 03/22/2009         Review of Systems   Constitution: Negative.   HENT: Negative.    Eyes: Negative.    Cardiovascular: Positive for dyspnea on exertion.   Respiratory: Positive for cough.    Endocrine: Negative.    Hematologic/Lymphatic: Negative.    Skin: Negative.    Musculoskeletal: Positive for joint pain.   Gastrointestinal: Negative.    Genitourinary: Negative.    Neurological: Negative.    Psychiatric/Behavioral: Negative.    Allergic/Immunologic: Negative.        Physical Exam  GENERAL: The patient is well developed, well nourished, resting comfortably and in no distress. ?  HEENT: No abnormalities of the visible oro-nasopharynx, conjunctiva or sclera are noted. ?  NECK: There is no jugular venous distension. Carotids are palpable and without bruits. There is no thyroid enlargement. ?  Chest: Lung fields are clear to auscultation. There are no wheezes or crackles. ?  CV: There is an irregular rhythm with an apical rate of?68?BPM?and variable intensity of the first heart sound.. There are no murmurs, gallops or rubs. ?  ABD: The abdomen is soft and supple with normal bowel sounds. There is no hepatosplenomegaly, ascites, tenderness, masses or bruits. ?  Neuro: There are no focal motor defects. Ambulation is normal. Cognitive function appears normal. ?  Ext:?There is trace to 1+ bipedal?edema?without?evidence of deep vein thrombosis. Peripheral pulses are satisfactory. ?  SKIN:?There are no rashes and no cellulitis  PSYCH:?The patient is calm, rationale and oriented    Cardiovascular Studies  A 12-lead ECG was obtained on 06/17/2019 reveals atrial fibrillation with a heart rate of 71 bpm.  There is no evidence of myocardial infarction or ischemia.  ?  Labs from 07/09/2019 revealed serum potassium 4.0 mmol/L and serum creatinine 0.93 mg/dL.  Labs from July 23, 2018 reveal serum potassium 4.4 mmol/L and serum creatinine 0.96 mg/dL. ?His ALT is 26. ?Total cholesterol 156, triglycerides 64, HDL 55 and LDL cholesterol 91 mg/dL. ?His TSH is normal at 1.09 microunits/mL.    Problems Addressed Today  Permanent atrial fibrillation.  Hypertension.  Hypercholesterolemia.      Assessment and Plan    [name]'s?pressure remains well controlled on his current medical regimen.???Regular?mild?aerobic exercise, weight loss and adherence to a heart healthy diet were recommended.?Currently his heart rate appears well controlled. ?I have asked the patient to keep a log book of his BP readings and pulse readings for further review. ?He did not want to change to a stronger statin because of difficulty with myalgias experienced with other statin medications. [name]'s CHA2DS2-VASc score is 2 for age and hypertension. The risks and benefits of anticoagulation therapy have been reviewed with the patient. The patient understands that anticoagulation is used to decrease thrombotic or clotting complications associated with atrial fibrillation/flutter, such as stroke and systemic embolization which can be disabling or fatal, but can, on occasion, lead to life-threatening bleeding complications including gastrointestinal and intracranial hemorrhage. The patient wishes to continue with anticoagulation. Anticoagulation options were presented to the patient which included warfarin or one of the newer direct acting oral anticoagulants and the patient wanted to continue taking warfarin. ?I once again talked to [name]?about rhythm management for his atrial fibrillation and discussed cardioversion, antiarrhythmic therapy and arrhythmia ablation. ?He did not want to pursue this form of treatment and wanted to continue with heart rate control and anticoagulation. ?I have asked [name] to continue working with the cardiovascular nurses so  that his INR can be monitored and his dose of warfarin adjusted accordingly.?I have asked him to return for follow-up in?6?months.         Current Medications (including today's revisions)  ? amLODIPine (NORVASC) 5 mg tablet TAKE 1 TABLET EVERY DAY   ? ezetimibe (ZETIA) 10 mg tablet TAKE 1 TABLET BY MOUTH ONCE DAILY MUST  MAKE  OFFICE  VISIT  FOR  ANY  ADDITIONAL  REFILLS   ? GREEN TEA EXTRACT PO Take 1 Tab by mouth Daily.   ? losartan-hydrochlorothiazide (HYZAAR) 50-12.5 mg tablet TAKE 1 TABLET EVERY DAY   ? metoprolol tartrate (LOPRESSOR) 50 mg tablet TAKE 1 TABLET TWICE DAILY   ? multivitamin (MULTIPLE VITAMIN) per tablet Take 1 Tab by mouth Daily.   ? pravastatin (PRAVACHOL) 80 mg tablet Take one tablet by mouth daily.   ? VIT B COMPLEX NO.10/FOLIC ACID (B COMPLEX 100 CR PO) Take 1 Tab by mouth Daily.   ? warfarin (COUMADIN) 2 mg tablet TAKE 1 TABLET BY MOUTH ONCE DAILY OR  AS  DIRECTED  BY  CARDIOLOGIST   ? warfarin (COUMADIN) 5 mg tablet Take 1/2 to 1 (one half to one) tablet by mouth once daily as directed by cardiologist.

## 2020-01-25 ENCOUNTER — Encounter: Admit: 2020-01-25 | Discharge: 2020-01-25 | Payer: MEDICARE

## 2020-01-25 LAB — PROTIME INR (PT): Lab: 2.2

## 2020-02-25 ENCOUNTER — Encounter: Admit: 2020-02-25 | Discharge: 2020-02-25 | Payer: MEDICARE

## 2020-02-25 DIAGNOSIS — I4891 Unspecified atrial fibrillation: Secondary | ICD-10-CM

## 2020-02-25 NOTE — Progress Notes
02/25/2020 9:14 AM   Verified dose with pt.

## 2020-04-07 ENCOUNTER — Encounter: Admit: 2020-04-07 | Discharge: 2020-04-07 | Payer: MEDICARE

## 2020-04-07 NOTE — Telephone Encounter
INR Overdue. Called and left message requesting pt to have INR drawn at earliest convenience.  Left callback number for any questions or concerns.

## 2020-04-10 ENCOUNTER — Encounter: Admit: 2020-04-10 | Discharge: 2020-04-10 | Payer: MEDICARE

## 2020-04-10 LAB — PROTIME INR (PT): Lab: 2.3 — ABNORMAL HIGH (ref 0.89–1.11)

## 2020-05-09 ENCOUNTER — Encounter: Admit: 2020-05-09 | Discharge: 2020-05-09 | Payer: MEDICARE

## 2020-05-09 DIAGNOSIS — I4891 Unspecified atrial fibrillation: Secondary | ICD-10-CM

## 2020-06-15 ENCOUNTER — Encounter: Admit: 2020-06-15 | Discharge: 2020-06-15 | Payer: MEDICARE

## 2020-06-15 DIAGNOSIS — I10 Essential (primary) hypertension: Secondary | ICD-10-CM

## 2020-06-15 DIAGNOSIS — Z7901 Long term (current) use of anticoagulants: Secondary | ICD-10-CM

## 2020-06-27 ENCOUNTER — Encounter: Admit: 2020-06-27 | Discharge: 2020-06-27 | Payer: MEDICARE

## 2020-06-27 MED ORDER — WARFARIN 2 MG PO TAB
ORAL_TABLET | Freq: Every day | ORAL | 1 refills | 90.00000 days | Status: AC
Start: 2020-06-27 — End: ?

## 2020-06-27 MED ORDER — EZETIMIBE 10 MG PO TAB
ORAL_TABLET | Freq: Every day | 1 refills | Status: AC
Start: 2020-06-27 — End: ?

## 2020-07-25 ENCOUNTER — Encounter: Admit: 2020-07-25 | Discharge: 2020-07-25 | Payer: MEDICARE

## 2020-07-25 NOTE — Progress Notes
Sent my chart reminder for overdue INR.    ----- Message -----  From: Florene Route, RN  Sent: 07/24/2020  To: [facility] [place]  Subject: INR Due 07/17/20                                  [place]ch[place], Jerry Price is due for an INR test on 07/17/20.

## 2020-08-20 ENCOUNTER — Encounter: Admit: 2020-08-20 | Discharge: 2020-08-20 | Payer: MEDICARE

## 2020-08-20 MED ORDER — PRAVASTATIN 80 MG PO TAB
ORAL_TABLET | Freq: Every day | 3 refills
Start: 2020-08-20 — End: ?

## 2020-08-21 ENCOUNTER — Encounter: Admit: 2020-08-21 | Discharge: 2020-08-21 | Payer: MEDICARE

## 2020-08-21 MED ORDER — METOPROLOL TARTRATE 50 MG PO TAB
ORAL_TABLET | Freq: Two times a day (BID) | ORAL | 3 refills | 90.00000 days | Status: AC
Start: 2020-08-21 — End: ?

## 2020-08-21 MED ORDER — AMLODIPINE 5 MG PO TAB
ORAL_TABLET | Freq: Every day | 3 refills | Status: AC
Start: 2020-08-21 — End: ?

## 2020-08-21 MED ORDER — LOSARTAN-HYDROCHLOROTHIAZIDE 50-12.5 MG PO TAB
ORAL_TABLET | Freq: Every day | ORAL | 3 refills | 28.00000 days | Status: AC
Start: 2020-08-21 — End: ?

## 2020-08-25 ENCOUNTER — Encounter: Admit: 2020-08-25 | Discharge: 2020-08-25 | Payer: MEDICARE

## 2020-08-25 DIAGNOSIS — I4891 Unspecified atrial fibrillation: Secondary | ICD-10-CM

## 2020-08-25 DIAGNOSIS — Z7901 Long term (current) use of anticoagulants: Secondary | ICD-10-CM

## 2020-08-25 NOTE — Progress Notes
Spoke to pt re: INR results. Verified current Coumadin dose. Pt to continue current therapeutic dose and recheck in ~ 1 mo. Pt verbalized understanding and was agreeable to this plan. No further needs identified at this time.

## 2020-09-29 ENCOUNTER — Encounter: Admit: 2020-09-29 | Discharge: 2020-09-29 | Payer: MEDICARE

## 2020-10-04 ENCOUNTER — Encounter: Admit: 2020-10-04 | Discharge: 2020-10-04 | Payer: MEDICARE

## 2020-10-04 DIAGNOSIS — I4891 Unspecified atrial fibrillation: Secondary | ICD-10-CM

## 2020-11-08 ENCOUNTER — Encounter: Admit: 2020-11-08 | Discharge: 2020-11-08 | Payer: MEDICARE

## 2020-11-08 NOTE — Telephone Encounter
Left message to remind patient about needing an overdue INR drawn. Phone number provided for any additional questions or concerns.

## 2020-11-09 ENCOUNTER — Encounter: Admit: 2020-11-09 | Discharge: 2020-11-09 | Payer: MEDICARE

## 2020-11-09 DIAGNOSIS — Z7901 Long term (current) use of anticoagulants: Secondary | ICD-10-CM

## 2020-11-09 LAB — PROTIME INR (PT): Lab: 2 — ABNORMAL HIGH (ref 0.89–1.11)

## 2020-12-11 ENCOUNTER — Encounter: Admit: 2020-12-11 | Discharge: 2020-12-11 | Payer: MEDICARE

## 2020-12-11 DIAGNOSIS — I4811 Longstanding persistent atrial fibrillation: Secondary | ICD-10-CM

## 2020-12-11 DIAGNOSIS — Z7901 Long term (current) use of anticoagulants: Secondary | ICD-10-CM

## 2020-12-11 LAB — PROTIME INR (PT): Lab: 2

## 2020-12-19 ENCOUNTER — Encounter: Admit: 2020-12-19 | Discharge: 2020-12-19 | Payer: MEDICARE

## 2020-12-19 MED ORDER — WARFARIN 2 MG PO TAB
ORAL_TABLET | Freq: Every day | ORAL | 0 refills | 90.00000 days | Status: AC
Start: 2020-12-19 — End: ?

## 2020-12-19 MED ORDER — EZETIMIBE 10 MG PO TAB
ORAL_TABLET | Freq: Every day | 0 refills | Status: AC
Start: 2020-12-19 — End: ?

## 2021-01-16 ENCOUNTER — Encounter: Admit: 2021-01-16 | Discharge: 2021-01-16 | Payer: MEDICARE

## 2021-01-16 DIAGNOSIS — Z7901 Long term (current) use of anticoagulants: Secondary | ICD-10-CM

## 2021-01-16 LAB — PROTIME INR (PT): Lab: 1.8 — ABNORMAL LOW

## 2021-01-23 ENCOUNTER — Encounter: Admit: 2021-01-23 | Discharge: 2021-01-23 | Payer: MEDICARE

## 2021-01-23 DIAGNOSIS — Z7901 Long term (current) use of anticoagulants: Secondary | ICD-10-CM

## 2021-01-23 DIAGNOSIS — I4891 Unspecified atrial fibrillation: Secondary | ICD-10-CM

## 2021-01-23 LAB — PROTIME INR (PT): Lab: 2.2 — ABNORMAL HIGH (ref 0.89–1.11)

## 2021-02-01 ENCOUNTER — Encounter: Admit: 2021-02-01 | Discharge: 2021-02-01 | Payer: MEDICARE

## 2021-02-01 DIAGNOSIS — Z8249 Family history of ischemic heart disease and other diseases of the circulatory system: Secondary | ICD-10-CM

## 2021-02-01 DIAGNOSIS — E785 Hyperlipidemia, unspecified: Secondary | ICD-10-CM

## 2021-02-01 DIAGNOSIS — E8881 Metabolic syndrome: Secondary | ICD-10-CM

## 2021-02-01 DIAGNOSIS — I10 Essential (primary) hypertension: Secondary | ICD-10-CM

## 2021-02-01 DIAGNOSIS — I4891 Unspecified atrial fibrillation: Secondary | ICD-10-CM

## 2021-02-01 DIAGNOSIS — E669 Obesity, unspecified: Secondary | ICD-10-CM

## 2021-02-01 MED ORDER — HYDROCHLOROTHIAZIDE 25 MG PO TAB
25 mg | ORAL_TABLET | Freq: Every morning | ORAL | 3 refills | 28.00000 days | Status: AC
Start: 2021-02-01 — End: ?

## 2021-02-01 MED ORDER — LOSARTAN 50 MG PO TAB
50 mg | ORAL_TABLET | Freq: Every day | ORAL | 3 refills | 30.00000 days | Status: AC
Start: 2021-02-01 — End: ?

## 2021-02-01 NOTE — Progress Notes
Date of Service: 02/01/2021    Jerry Price is a 75 y.o. male.       HPI     [name] has been followed for?permanent?atrial fibrillation. hypertension, hyperlipidemia, and a family history of coronary artery disease.? He reports no febrile or infectious symptoms during the Covid pandemic and has received his Moderna Covid vaccines.?He reports that his blood pressure is been under good control.  His weight is up 17 pounds over the past year.   For many years [name] indicated his preference for a strategy of heart rate control and anticoagulation?and has declined cardioversion, antiarrhythmic therapy and cardiac arrhythmia ablation.? He has noticed some lethargy and fatigue in recent months coincident with his weight gain.  He has not been exercising or checking his blood pressure. ?Otherwise, he has been stable from a cardiovascular perspective and reports no angina, congestive symptoms, palpitations, sensation of sustained forceful heart pounding, lightheadedness or syncope. His exercise tolerance has probably slowly decreased in recent years.  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 The patient reports no myalgias, bleeding abnormalities, or strokelike symptoms. He reports no recent emergency room visits or hospitalizations. He tolerates?pravastatin but has not tolerated other statins because of myalgias.   Historically, in January 2013 [name] developed paroxysmal atrial fibrillation. He was placed on Pradaxa and on 10/30/2011 he underwent TEE-guided cardioversion with excellent results. In August 2013 he switched from Pradaxa to warfarin because of affordability. He developed recurrence of atrial fibrillation and on 07/09/12 underwent repeat cardioversion and was placed on dronedarone for suppression. In October 2015 he was taken off dronedarone when he was found to be in atrial fibrillation and indicated that he did not want to continue antiarrhythmic therapy or pursue cardioversion or arrhythmia ablation.[name] reports that a skin cancer was removed from his right upper arm in January 2017.?       Vitals:    02/01/21 1050 02/01/21 1102   BP: 136/88 (!) 140/82   BP Source: Arm, Left Upper Arm, Right Upper   Pulse: 80    SpO2: 97%    O2 Device: None (Room air)    PainSc: Zero    Weight: 117.9 kg (260 lb)    Height: 177.8 cm (5' 10)      Body mass index is 37.31 kg/m?Marland Kitchen     Past Medical History  Patient Active Problem List    Diagnosis Date Noted   ? Heart palpitations 10/23/2011   ? Atrial fibrillation (HCC) 10/23/2011   ? Family history of coronary arteriosclerosis 03/22/2009   ? Observation for suspected coronary artery disease (CAD) 03/22/2009     Cardiac surveillance testing due to chest pain       a.  10/97 - Treadmill done @ [facility]: nondiagnostic ST segment changes,            non-ischemic study     ? Chest pain 03/22/2009   ? Hypertension 03/22/2009     Hypertension - diagnosed in 1997     ? Hyperlipemia 03/22/2009     Mixed Hyperlipidemia-(Elev. Trigs & LDL, Low HDL)       a. Homocystinemia, normal LPa       b. Elevated hsCRP     ? History of tobacco use 03/22/2009     History of tobacco use: Quit smoking 10-12 years ago.  Hx: 3 packs/day for 20 years.     ? ETOH abuse 03/22/2009  ETOH abuse-12 pack beer/day-elevated LFTs on Lipitor 40mg  + ETOH     ? Metabolic syndrome X 03/22/2009   ? Gout 03/22/2009         Review of Systems   Constitutional: Positive for malaise/fatigue.   HENT: Negative.    Eyes: Negative.    Cardiovascular: Positive for dyspnea on exertion.   Endocrine: Negative.    Hematologic/Lymphatic: Negative.    Skin: Negative.    Musculoskeletal: Positive for joint pain.   Gastrointestinal: Negative.    Genitourinary: Negative.    Neurological: Negative.    Psychiatric/Behavioral: Negative.    Allergic/Immunologic: Negative.        Physical Exam  GENERAL: The patient is well developed, well nourished, resting comfortably and in no distress. ?  HEENT: No abnormalities of the visible oro-nasopharynx, conjunctiva or sclera are noted. ?  NECK: There is no jugular venous distension. Carotids are palpable and without bruits. There is no thyroid enlargement. ?  Chest: Lung fields are clear to auscultation. There are no wheezes or crackles. ?  CV: There is an irregular rhythm with an apical rate of?68?BPM?and variable intensity of the first heart sound.. There are no murmurs, gallops or rubs. ?  ABD: The abdomen is soft and supple with normal bowel sounds. There is no hepatosplenomegaly, ascites, tenderness, masses or bruits. ?  Neuro: There are no focal motor defects. Ambulation is normal. Cognitive function appears normal. ?  Ext:?There is trace to 2-3+  bipedal?edema?without?evidence of deep vein thrombosis. Peripheral pulses are satisfactory. ?  SKIN:?There are no rashes and no cellulitis  PSYCH:?The patient is calm, rationale and oriented    Cardiovascular Studies  A twelve-lead ECG was obtained on 02/01/2021 reveals atrial fibrillation with a well-controlled heart rate of 66 bpm.    Cardiovascular Health Factors  Vitals BP Readings from Last 3 Encounters:   02/01/21 (!) 140/82   01/13/20 126/78   06/17/19 128/84     Wt Readings from Last 3 Encounters:   02/01/21 117.9 kg (260 lb)   01/13/20 110.2 kg (243 lb)   06/17/19 110.2 kg (243 lb)     BMI Readings from Last 3 Encounters:   02/01/21 37.31 kg/m?   01/13/20 36.95 kg/m?   06/17/19 36.95 kg/m?      Smoking Social History     Tobacco Use   Smoking Status Former Smoker   ? Years: 53.00   ? Types: Cigarettes   ? Quit date: 10/15/1991   ? Years since quitting: 29.3   Smokeless Tobacco Never Used      Lipid Profile Cholesterol   Date Value Ref Range Status   07/23/2018 156  Final     HDL   Date Value Ref Range Status   07/23/2018 55  Final     LDL   Date Value Ref Range Status   07/23/2018 91  Final     Triglycerides   Date Value Ref Range Status 07/23/2018 64  Final      Blood Sugar Hemoglobin A1C   Date Value Ref Range Status   06/28/2003 5.4 4.7 - 6.2 % Final     Glucose   Date Value Ref Range Status   07/09/2019 120 (H) 70 - 105 Final   07/23/2018 104  Final   05/28/2018 100  Final   07/28/2003 109 70 - 110 MG/DL Final   16/07/9603 540 70 - 110 MG/DL Final          Problems Addressed Today  Encounter Diagnoses   Name  Primary?   ? Atrial fibrillation, unspecified type (HCC) Yes   ? Hyperlipidemia, unspecified hyperlipidemia type    ? Primary hypertension        Assessment and Plan   1) [name] does not appear to be in heart failure.  However, I have asked that he obtain an echo Doppler study to assess for left ventricular dysfunction and other forms of structural cardiac disease.  2) ?Currently his heart rate appears well controlled. [name]'s CHA2DS2-VASc score is 2 for age and hypertension. The risks and benefits of anticoagulation therapy have been reviewed with the patient. The patient understands that anticoagulation is used to decrease thrombotic or clotting complications associated with atrial fibrillation/flutter, such as stroke and systemic embolization which can be disabling or fatal, but can, on occasion, lead to life-threatening bleeding complications including gastrointestinal and intracranial hemorrhage. The patient wishes to continue with anticoagulation. Anticoagulation options were presented to the patient which included warfarin or one of the newer direct acting oral anticoagulants and the patient wanted to continue taking warfarin. ?I once again talked to [name]?about rhythm management for his atrial fibrillation and discussed cardioversion, antiarrhythmic therapy and arrhythmia ablation. ?He did not want to pursue this form of treatment and wanted to continue with heart rate control and anticoagulation. ?I have asked [name] to continue working with the cardiovascular nurses so that his INR can be monitored and his dose of warfarin adjusted accordingly.  3) alternatives for treatment of hypertension reviewed with the patient and he wanted to increase his hydrochlorothiazide to 25 mg daily.  This may help with his edema as well.  He was also instructed to use support stockings for control of his edema.The patient was given a requisition to check a fasting lipid profile, serum magnesium, ALT, and Chem 7.  ?I have asked the patient to keep a log book of his BP readings and pulse readings for further review.   4) ?He did not want to change to a stronger statin because of difficulty with myalgias experienced with other statin medications.  I have asked him to return for follow-up in 3 months time.         Current Medications (including today's revisions)  ? amLODIPine (NORVASC) 5 mg tablet TAKE 1 TABLET EVERY DAY   ? ezetimibe (ZETIA) 10 mg tablet TAKE 1 TABLET BY MOUTH ONCE DAILY MUST  MAKE  OFFICE  VISIT  FOR  ANY  ADDITIONAL  REFILLS   ? GREEN TEA EXTRACT PO Take 1 Tab by mouth Daily.   ? hydroCHLOROthiazide (HYDRODIURIL) 25 mg tablet Take one tablet by mouth every morning.   ? losartan (COZAAR) 50 mg tablet Take one tablet by mouth daily.   ? metoprolol tartrate (LOPRESSOR) 50 mg tablet TAKE 1 TABLET TWICE DAILY   ? multivitamin (MULTIPLE VITAMIN) per tablet Take 1 Tab by mouth Daily.   ? pravastatin (PRAVACHOL) 80 mg tablet TAKE 1 TABLET EVERY DAY   ? VIT B COMPLEX NO.10/FOLIC ACID (B COMPLEX 100 CR PO) Take 1 Tab by mouth Daily.   ? warfarin (COUMADIN) 2 mg tablet TAKE 1 TABLET BY MOUTH ONCE DAILY OR  AS  DIRECTED  BY  CARDIOLOGIST*   ? warfarin (COUMADIN) 5 mg tablet Take 1/2 to 1 (one half to one) tablet by mouth once daily as directed by cardiologist.

## 2021-02-01 NOTE — Patient Instructions
Labs  Echo   Med changes as directed  Follow up as directed.  Call sooner if issues.  Call the [place] nursing line at [phone].  Leave a detailed message for the nurse in [place] with how we can assist you and we will call you back.

## 2021-02-05 ENCOUNTER — Encounter: Admit: 2021-02-05 | Discharge: 2021-02-05 | Payer: MEDICARE

## 2021-02-05 DIAGNOSIS — E785 Hyperlipidemia, unspecified: Secondary | ICD-10-CM

## 2021-02-05 DIAGNOSIS — I4891 Unspecified atrial fibrillation: Secondary | ICD-10-CM

## 2021-02-05 DIAGNOSIS — I10 Essential (primary) hypertension: Secondary | ICD-10-CM

## 2021-02-05 LAB — LIPID PROFILE
CHOLESTEROL: 149
HDL: 44
TRIGLYCERIDES: 80

## 2021-02-05 LAB — BASIC METABOLIC PANEL
ANION GAP: 11
BLD UREA NITROGEN: 13
CALCIUM: 9.3
CHLORIDE: 100
CO2: 28
CREATININE: 0.8
GFR ESTIMATED: 98
GLUCOSE,PANEL: 111 — ABNORMAL HIGH (ref 70–105)
POTASSIUM: 4.3
SODIUM: 135 — ABNORMAL LOW (ref 136–145)

## 2021-02-05 LAB — ALT (SGPT): ALT: 26

## 2021-02-05 LAB — MAGNESIUM: MAGNESIUM: 1.9

## 2021-02-07 ENCOUNTER — Encounter: Admit: 2021-02-07 | Discharge: 2021-02-07 | Payer: MEDICARE

## 2021-02-07 NOTE — Telephone Encounter
-----   Message from Hester Mates, MD sent at 02/07/2021 10:09 AM CDT -----  To all: Lipid profile appears stable if he is content with his current lipid-lowering regimen.  Thanks.  SBG  ----- Message -----  From: Floy Sabina, RN  Sent: 02/05/2021  12:05 PM CDT  To: Hester Mates, MD    Follow up labs from recent OV 4/21 for your review and recommendations. Pt currently on pravastatin 80mg  and 10mg  zetia daily. At OV 4/21, pt did not want to increase statin at this time because of previous difficulty with myalgias.

## 2021-02-07 NOTE — Telephone Encounter
Results discussed with patient. Pt agreeable to continuing his current course of medication. No other questions at this time. Verbalized understanding.

## 2021-02-08 ENCOUNTER — Encounter: Admit: 2021-02-08 | Discharge: 2021-02-08 | Payer: MEDICARE

## 2021-02-08 NOTE — Telephone Encounter
-----   Message from Steven B Gollub, MD sent at 02/07/2021 10:09 AM CDT -----  To all: Lipid profile appears stable if he is content with his current lipid-lowering regimen.  Thanks.  SBG  ----- Message -----  From: Bosch, Megan, RN  Sent: 02/05/2021  12:05 PM CDT  To: Steven B Gollub, MD    Follow up labs from recent OV 4/21 for your review and recommendations. Pt currently on pravastatin 80mg and 10mg zetia daily. At OV 4/21, pt did not want to increase statin at this time because of previous difficulty with myalgias.

## 2021-02-08 NOTE — Telephone Encounter
Results and recommendations called to patient. Patient has no questions at this time.

## 2021-02-21 ENCOUNTER — Encounter: Admit: 2021-02-21 | Discharge: 2021-02-21 | Payer: MEDICARE

## 2021-02-21 ENCOUNTER — Ambulatory Visit: Admit: 2021-02-21 | Discharge: 2021-02-21 | Payer: MEDICARE

## 2021-02-21 DIAGNOSIS — E785 Hyperlipidemia, unspecified: Secondary | ICD-10-CM

## 2021-02-21 DIAGNOSIS — I10 Essential (primary) hypertension: Secondary | ICD-10-CM

## 2021-02-21 DIAGNOSIS — I4891 Unspecified atrial fibrillation: Secondary | ICD-10-CM

## 2021-02-21 MED ORDER — PERFLUTREN LIPID MICROSPHERES 1.1 MG/ML IV SUSP
1-10 mL | Freq: Once | INTRAVENOUS | 0 refills | Status: CP | PRN
Start: 2021-02-21 — End: ?
  Administered 2021-02-21: 20:00:00 1 mL via INTRAVENOUS

## 2021-02-22 ENCOUNTER — Encounter: Admit: 2021-02-22 | Discharge: 2021-02-22 | Payer: MEDICARE

## 2021-02-22 NOTE — Telephone Encounter
-----   Message from Hester Mates, MD sent at 02/21/2021  4:34 PM CDT -----  To all: Normal left ventricular systolic function with mild to moderate aortic valve stenosis.  No action is required at this time.  We can follow over time.  Thanks.  SBG  ----- Message -----  From: Hester Mates, MD  Sent: 02/21/2021   3:55 PM CDT  To: Hester Mates, MD

## 2021-02-22 NOTE — Telephone Encounter
Discussed results and recommendations with patient. Patient has no questions at this time.

## 2021-02-23 ENCOUNTER — Encounter: Admit: 2021-02-23 | Discharge: 2021-02-23 | Payer: MEDICARE

## 2021-02-23 DIAGNOSIS — Z7901 Long term (current) use of anticoagulants: Secondary | ICD-10-CM

## 2021-02-23 LAB — PROTIME INR (PT): INR: 2.2

## 2021-03-13 ENCOUNTER — Encounter: Admit: 2021-03-13 | Discharge: 2021-03-13 | Payer: MEDICARE

## 2021-03-25 ENCOUNTER — Encounter: Admit: 2021-03-25 | Discharge: 2021-03-25 | Payer: MEDICARE

## 2021-03-25 MED ORDER — EZETIMIBE 10 MG PO TAB
ORAL_TABLET | Freq: Every day | 0 refills
Start: 2021-03-25 — End: ?

## 2021-03-25 MED ORDER — WARFARIN 2 MG PO TAB
ORAL_TABLET | Freq: Every day | 0 refills
Start: 2021-03-25 — End: ?

## 2021-03-27 ENCOUNTER — Encounter: Admit: 2021-03-27 | Discharge: 2021-03-27 | Payer: MEDICARE

## 2021-03-27 DIAGNOSIS — Z7901 Long term (current) use of anticoagulants: Secondary | ICD-10-CM

## 2021-03-27 LAB — PROTIME INR (PT): INR: 2.4

## 2021-04-27 ENCOUNTER — Encounter: Admit: 2021-04-27 | Discharge: 2021-04-27 | Payer: MEDICARE

## 2021-04-27 DIAGNOSIS — Z7901 Long term (current) use of anticoagulants: Secondary | ICD-10-CM

## 2021-04-27 LAB — PROTIME INR (PT): INR: 2.3

## 2021-05-30 ENCOUNTER — Encounter: Admit: 2021-05-30 | Discharge: 2021-05-30 | Payer: MEDICARE

## 2021-05-30 DIAGNOSIS — Z7901 Long term (current) use of anticoagulants: Secondary | ICD-10-CM

## 2021-05-30 LAB — PROTIME INR (PT): INR: 2.2

## 2021-07-02 ENCOUNTER — Encounter: Admit: 2021-07-02 | Discharge: 2021-07-02 | Payer: MEDICARE

## 2021-07-02 DIAGNOSIS — Z7901 Long term (current) use of anticoagulants: Secondary | ICD-10-CM

## 2021-07-02 LAB — PROTIME INR (PT): INR: 1.9

## 2021-07-10 ENCOUNTER — Encounter: Admit: 2021-07-10 | Discharge: 2021-07-10 | Payer: MEDICARE

## 2021-07-10 DIAGNOSIS — Z7901 Long term (current) use of anticoagulants: Secondary | ICD-10-CM

## 2021-07-10 LAB — PROTIME INR (PT): INR: 2.2 — ABNORMAL HIGH (ref 0.89–1.11)

## 2021-08-02 ENCOUNTER — Encounter: Admit: 2021-08-02 | Discharge: 2021-08-02 | Payer: MEDICARE

## 2021-08-02 DIAGNOSIS — E785 Hyperlipidemia, unspecified: Secondary | ICD-10-CM

## 2021-08-02 DIAGNOSIS — I4891 Unspecified atrial fibrillation: Secondary | ICD-10-CM

## 2021-08-02 DIAGNOSIS — E8881 Metabolic syndrome: Secondary | ICD-10-CM

## 2021-08-02 DIAGNOSIS — I10 Essential (primary) hypertension: Secondary | ICD-10-CM

## 2021-08-02 DIAGNOSIS — Z8249 Family history of ischemic heart disease and other diseases of the circulatory system: Secondary | ICD-10-CM

## 2021-08-02 DIAGNOSIS — E669 Obesity, unspecified: Secondary | ICD-10-CM

## 2021-08-02 NOTE — Progress Notes
Date of Service: 08/02/2021    Jerry Price is a 75 y.o. male.       HPI     Jerry Price has been followed for?permanent?atrial fibrillation. hypertension, hyperlipidemia, and a family history of coronary artery disease.??He reports no febrile or infectious symptoms during the Covid pandemic and has received his Moderna?Covid vaccines.?He reports that his blood pressure is been under good control.??His weight is actually down 6 pounds from April 2022. For many years Jerry Price indicated his preference for a strategy of heart rate control and anticoagulation?and has declined cardioversion, antiarrhythmic therapy and cardiac arrhythmia ablation.? He has not been exercising or checking his blood pressure. ?Otherwise, he has been stable from a cardiovascular perspective and reports no angina, congestive symptoms, palpitations, sensation of sustained forceful heart pounding, lightheadedness or syncope. His exercise has been stable over the past year and he indicates that he was able to walk extended distances at the [employer] last week. 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 The patient reports no myalgias, bleeding abnormalities,?or strokelike symptoms. He reports no recent emergency room visits or hospitalizations. He tolerates?pravastatin but has not tolerated other statins because of myalgias.   Historically, in January 2013 Jerry Price developed paroxysmal atrial fibrillation. He was placed on Pradaxa and on 10/30/2011 he underwent TEE-guided cardioversion with excellent results. In August 2013 he switched from Pradaxa to warfarin because of affordability. He developed recurrence of atrial fibrillation and on 07/09/12 underwent repeat cardioversion and was placed on dronedarone for suppression. In October 2015 he was taken off dronedarone when he was found to be in atrial fibrillation and indicated that he did not want to continue antiarrhythmic therapy or pursue cardioversion or arrhythmia ablation.Jerry Price reports that a skin cancer was removed from his right upper arm in January 2017.?       Vitals:    08/02/21 1245   BP: 134/80   BP Source: Arm, Left Upper   Pulse: 62   SpO2: 99%   O2 Device: None (Room air)   PainSc: Zero   Weight: 115.2 kg (254 lb)   Height: 177.8 cm (5' 10)     Body mass index is 36.45 kg/m?Marland Kitchen     Past Medical History  Patient Active Problem List    Diagnosis Date Noted   ? Heart palpitations 10/23/2011   ? Atrial fibrillation (HCC) 10/23/2011   ? Family history of coronary arteriosclerosis 03/22/2009   ? Observation for suspected coronary artery disease (CAD) 03/22/2009     Cardiac surveillance testing due to chest pain       a.  10/97 - Treadmill done @ [facility]: nondiagnostic ST segment changes,            non-ischemic study     ? Chest pain 03/22/2009   ? Hypertension 03/22/2009     Hypertension - diagnosed in 1997     ? Hyperlipemia 03/22/2009     Mixed Hyperlipidemia-(Elev. Trigs & LDL, Low HDL)       a. Homocystinemia, normal LPa       b. Elevated hsCRP     ? History of tobacco use 03/22/2009     History of tobacco use: Quit smoking 10-12 years ago.  Hx: 3 packs/day for 20 years.     ? 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 Metabolic syndrome  X 03/22/2009   ? Gout 03/22/2009         Review of Systems   Constitutional: Negative.   HENT: Negative.    Eyes: Negative.    Cardiovascular: Negative.    Respiratory: Negative.    Endocrine: Negative.    Hematologic/Lymphatic: Negative.    Skin: Negative.    Musculoskeletal: Negative.    Gastrointestinal: Negative.    Genitourinary: Negative.    Neurological: Negative.    Psychiatric/Behavioral: Negative.    Allergic/Immunologic: Negative.        Physical Exam  GENERAL: The patient is well developed, well nourished, resting comfortably and in no distress. ?  HEENT: No abnormalities of the visible oro-nasopharynx, conjunctiva or sclera are noted. ?  NECK: There is no jugular venous distension. Carotids are palpable and without bruits. There is no thyroid enlargement. ?  Chest: Lung fields are clear to auscultation. There are no wheezes or crackles. ?  CV: There is an irregular rhythm with an apical rate of?64?BPM?and variable intensity of the first heart sound.  A grade 2/6 to 3/6 systolic ejection murmur is heard best in the right upper sternal border.. There are no diastolic murmurs, gallops or rubs. ?  ABD: The abdomen is soft and supple with normal bowel sounds. There is no hepatosplenomegaly, ascites, tenderness, masses or bruits. ?  Neuro: There are no focal motor defects. Ambulation is normal. Cognitive function appears normal. ?  Ext:?There is trace bipedal?edema?without?evidence of deep vein thrombosis. Peripheral pulses are satisfactory. ?  SKIN:?There are no rashes and no cellulitis  PSYCH:?The patient is calm, rationale and oriented    Cardiovascular Studies  A twelve-lead ECG was obtained on 02/01/2021 reveals atrial fibrillation with a well-controlled heart rate of 66 bpm.  Echo Doppler 02/21/2021:  Interpretation Summary    1. No regional wall motion abnormalities are seen. Overall left ventricular systolic function appears normal. The estimated left ventricular ejection fraction is 60-65%.  2. The right ventricle is not visualized well. ?Limited images suggest mild right ventricular enlargement with normal right ventricular contractility.  3. Severe biatrial enlargement.  4. Mild to moderate aortic valve stenosis.  5. Mild mitral valve regurgitation is noted by Doppler exam.  6. 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  7. No pericardial effusion is seen.  8. The estimated peak systolic PA pressure = 51 mmHg.  9. The cardiac rhythm is irregular suggestive for atrial fibrillation.      Cardiovascular Health Factors  Vitals BP Readings from Last 3 Encounters:   08/02/21 134/80   02/21/21 (!) 146/82   02/01/21 (!) 140/82     Wt Readings from Last 3 Encounters:   08/02/21 115.2 kg (254 lb)   02/21/21 115.5 kg (254 lb 9.6 oz)   02/01/21 117.9 kg (260 lb)     BMI Readings from Last 3 Encounters:   08/02/21 36.45 kg/m?   02/21/21 36.53 kg/m?   02/01/21 37.31 kg/m?      Smoking Social History     Tobacco Use   Smoking Status Former Smoker   ? Years: 53.00   ? Types: Cigarettes   ? Quit date: 10/15/1991   ? Years since quitting: 29.8   Smokeless Tobacco Never Used      Lipid Profile Cholesterol   Date Value Ref Range Status   02/05/2021 149  Final     HDL   Date Value Ref Range Status   02/05/2021 44  Final     LDL   Date Value Ref Range Status   02/05/2021 89  Final     Triglycerides   Date Value Ref Range Status   02/05/2021 80  Final      Blood Sugar Hemoglobin A1C   Date Value Ref Range Status   06/28/2003 5.4 4.7 - 6.2 % Final     Glucose   Date Value Ref Range Status   02/05/2021 111 (H) 70 - 105 Final   07/09/2019 120 (H) 70 - 105 Final   07/23/2018 104  Final   07/28/2003 109 70 - 110 MG/DL Final   16/07/9603 540 70 - 110 MG/DL Final          Problems Addressed Today  Encounter Diagnoses   Name Primary?   ? Atrial fibrillation, unspecified type (HCC)    ? Hyperlipidemia, unspecified hyperlipidemia type    ? Primary hypertension        Assessment and Plan     ?Currently his heart rate appears well controlled. Jerry Price's CHA2DS2-VASc score is 2 for age and hypertension. The risks and benefits of anticoagulation therapy have been reviewed with the patient. The patient understands that anticoagulation is used to decrease thrombotic or clotting complications associated with atrial fibrillation/flutter, such as stroke and systemic embolization which can be disabling or fatal, but can, on occasion, lead to life-threatening bleeding complications including gastrointestinal and intracranial hemorrhage. The patient wishes to continue with anticoagulation. Anticoagulation options were presented to the patient which included warfarin or one of the newer direct acting oral anticoagulants and the patient wanted to continue taking warfarin. ?I once again talked to Jerry Price?about rhythm management for his atrial fibrillation and discussed cardioversion, antiarrhythmic therapy and arrhythmia ablation. ?He did not want to pursue this form of treatment and wanted to continue with heart rate control and anticoagulation. ?I have asked Jerry Price to continue working with the cardiovascular nurses so that his INR can be monitored and his dose of warfarin adjusted accordingly.  In addition, he knows that he can switch to Eliquis from warfarin anytime he wants.  His blood pressure looks better with the increased dose of hydrochlorothiazide. ?I have asked the patient to keep a log book of his BP readings and pulse readings for further review.   Alternatives for the treatment of hypercholesterolemia were reviewed the patient. ?He did not want to change to a stronger statin because of difficulty with myalgias experienced with other statin medications.   He has mild to moderate aortic valve stenosis which does not appear to be affecting his exercise tolerance at the present time. I have asked him to return for follow-up in 6 months time. The total time spent during this interview and exam was 30 minutes.         Current Medications (including today's revisions)  ? amLODIPine (NORVASC) 5 mg tablet TAKE 1 TABLET EVERY DAY   ? ezetimibe (ZETIA) 10 mg tablet Take one tablet by mouth daily.   ? GREEN TEA EXTRACT PO Take 1 Tab by mouth Daily.   ? hydroCHLOROthiazide (HYDRODIURIL) 25 mg tablet Take one tablet by mouth every morning.   ? losartan (COZAAR) 50 mg tablet Take one tablet by mouth daily.   ? metoprolol tartrate (LOPRESSOR) 50 mg tablet TAKE 1 TABLET TWICE DAILY   ? multivitamin (ONE-A-DAY) tablet Take 1 Tab by mouth Daily.   ? pravastatin (PRAVACHOL) 80 mg tablet TAKE 1 TABLET EVERY DAY   ? VIT B COMPLEX NO.10/FOLIC  ACID (B COMPLEX 100 CR PO) Take 1 Tab by mouth Daily.   ? warfarin (COUMADIN) 2 mg tablet TAKE 1 TABLET BY MOUTH ONCE DAILY OR  AS  DIRECTED  BY  CARDIOLOGIST   ? warfarin (COUMADIN) 5 mg tablet Take 1/2 to 1 (one half to one) tablet by mouth once daily as directed by cardiologist.

## 2021-08-09 ENCOUNTER — Encounter: Admit: 2021-08-09 | Discharge: 2021-08-09 | Payer: MEDICARE

## 2021-08-09 DIAGNOSIS — Z7901 Long term (current) use of anticoagulants: Secondary | ICD-10-CM

## 2021-08-09 LAB — PROTIME INR (PT): INR: 2.1 — ABNORMAL HIGH (ref 0.89–1.11)

## 2021-09-03 ENCOUNTER — Encounter: Admit: 2021-09-03 | Discharge: 2021-09-03 | Payer: MEDICARE

## 2021-09-03 MED ORDER — PRAVASTATIN 80 MG PO TAB
ORAL_TABLET | Freq: Every day | ORAL | 3 refills | 90.00000 days | Status: AC
Start: 2021-09-03 — End: ?

## 2021-09-12 ENCOUNTER — Encounter: Admit: 2021-09-12 | Discharge: 2021-09-12 | Payer: MEDICARE

## 2021-09-12 DIAGNOSIS — I4891 Unspecified atrial fibrillation: Secondary | ICD-10-CM

## 2021-09-12 LAB — PROTIME INR (PT): INR: 2.1 MMOL/L — ABNORMAL HIGH (ref 0.89–1.11)

## 2021-09-26 ENCOUNTER — Encounter: Admit: 2021-09-26 | Discharge: 2021-09-26 | Payer: MEDICARE

## 2021-09-26 MED ORDER — METOPROLOL TARTRATE 50 MG PO TAB
ORAL_TABLET | Freq: Two times a day (BID) | ORAL | 3 refills | 90.00000 days | Status: AC
Start: 2021-09-26 — End: ?

## 2021-09-26 MED ORDER — AMLODIPINE 5 MG PO TAB
ORAL_TABLET | Freq: Every day | 3 refills | Status: AC
Start: 2021-09-26 — End: ?

## 2021-10-18 ENCOUNTER — Encounter: Admit: 2021-10-18 | Discharge: 2021-10-18 | Payer: MEDICARE

## 2021-10-18 DIAGNOSIS — I4891 Unspecified atrial fibrillation: Secondary | ICD-10-CM

## 2021-10-18 LAB — PROTIME INR (PT): INR: 2 — ABNORMAL HIGH (ref 0.89–1.11)

## 2021-10-18 NOTE — Progress Notes
10/18/2021 9:44 AM   Verified dose with pt.

## 2021-11-20 ENCOUNTER — Encounter: Admit: 2021-11-20 | Discharge: 2021-11-20 | Payer: MEDICARE

## 2021-11-20 DIAGNOSIS — I4891 Unspecified atrial fibrillation: Secondary | ICD-10-CM

## 2021-11-20 LAB — PROTIME INR (PT): INR: 2.1 K/UL — ABNORMAL HIGH (ref 0.89–1.11)

## 2021-12-18 ENCOUNTER — Encounter: Admit: 2021-12-18 | Discharge: 2021-12-18 | Payer: MEDICARE

## 2021-12-18 DIAGNOSIS — I4891 Unspecified atrial fibrillation: Secondary | ICD-10-CM

## 2021-12-18 LAB — PROTIME INR (PT): INR: 2.3

## 2021-12-20 ENCOUNTER — Encounter: Admit: 2021-12-20 | Discharge: 2021-12-20 | Payer: MEDICARE

## 2021-12-20 MED ORDER — HYDROCHLOROTHIAZIDE 25 MG PO TAB
ORAL_TABLET | ORAL | 0 refills | 28.00000 days | Status: AC
Start: 2021-12-20 — End: ?

## 2021-12-20 MED ORDER — LOSARTAN 50 MG PO TAB
ORAL_TABLET | ORAL | 0 refills | 90.00000 days | Status: AC
Start: 2021-12-20 — End: ?

## 2022-01-16 ENCOUNTER — Encounter: Admit: 2022-01-16 | Discharge: 2022-01-16 | Payer: MEDICARE

## 2022-01-16 DIAGNOSIS — I4891 Unspecified atrial fibrillation: Secondary | ICD-10-CM

## 2022-01-16 LAB — PROTIME INR (PT): INR: 2.3 — ABNORMAL HIGH (ref 0.89–1.11)

## 2022-02-19 ENCOUNTER — Encounter: Admit: 2022-02-19 | Discharge: 2022-02-19 | Payer: MEDICARE

## 2022-02-19 DIAGNOSIS — I4891 Unspecified atrial fibrillation: Secondary | ICD-10-CM

## 2022-02-19 LAB — PROTIME INR (PT): INR: 2

## 2022-03-20 ENCOUNTER — Encounter: Admit: 2022-03-20 | Discharge: 2022-03-20 | Payer: MEDICARE

## 2022-03-20 MED ORDER — WARFARIN 2 MG PO TAB
ORAL_TABLET | ORAL | 1 refills | 90.00000 days | Status: AC
Start: 2022-03-20 — End: ?

## 2022-03-20 MED ORDER — EZETIMIBE 10 MG PO TAB
ORAL_TABLET | 1 refills | Status: AC
Start: 2022-03-20 — End: ?

## 2022-03-26 ENCOUNTER — Encounter: Admit: 2022-03-26 | Discharge: 2022-03-26 | Payer: MEDICARE

## 2022-03-26 DIAGNOSIS — I4891 Unspecified atrial fibrillation: Secondary | ICD-10-CM

## 2022-03-26 LAB — PROTIME INR (PT): INR: 1.9 g/dL — ABNORMAL HIGH (ref 0.89–1.11)

## 2022-04-23 ENCOUNTER — Encounter: Admit: 2022-04-23 | Discharge: 2022-04-23 | Payer: MEDICARE

## 2022-04-23 NOTE — Telephone Encounter
Spoke to pt re: overdue INR and he states he plans to check tomorrow AM.    ----- Message -----  From: Rogelia Boga, RN  Sent: 04/16/2022  12:00 AM CDT  To: [facility] Nurse [place]  Subject: INR Due 04/09/22                                  Jerry Price, Jerry Price is due for an INR test on 04/09/22.

## 2022-04-24 ENCOUNTER — Encounter: Admit: 2022-04-24 | Discharge: 2022-04-24 | Payer: MEDICARE

## 2022-04-24 DIAGNOSIS — I4891 Unspecified atrial fibrillation: Secondary | ICD-10-CM

## 2022-04-24 LAB — PROTIME INR (PT): INR: 1.8 — ABNORMAL HIGH (ref 0.89–1.11)

## 2022-05-01 ENCOUNTER — Encounter: Admit: 2022-05-01 | Discharge: 2022-05-01 | Payer: MEDICARE

## 2022-05-01 DIAGNOSIS — I4891 Unspecified atrial fibrillation: Secondary | ICD-10-CM

## 2022-05-01 LAB — PROTIME INR (PT): INR: 2.1 — ABNORMAL HIGH (ref 0.89–1.11)

## 2022-05-15 ENCOUNTER — Encounter: Admit: 2022-05-15 | Discharge: 2022-05-15 | Payer: MEDICARE

## 2022-05-15 DIAGNOSIS — I4891 Unspecified atrial fibrillation: Secondary | ICD-10-CM

## 2022-05-15 LAB — PROTIME INR (PT): INR: 2.4 — ABNORMAL HIGH (ref 0.89–1.11)

## 2022-06-06 ENCOUNTER — Encounter: Admit: 2022-06-06 | Discharge: 2022-06-06 | Payer: MEDICARE

## 2022-06-06 DIAGNOSIS — E8881 Metabolic syndrome: Secondary | ICD-10-CM

## 2022-06-06 DIAGNOSIS — E785 Hyperlipidemia, unspecified: Secondary | ICD-10-CM

## 2022-06-06 DIAGNOSIS — E669 Obesity, unspecified: Secondary | ICD-10-CM

## 2022-06-06 DIAGNOSIS — R0989 Other specified symptoms and signs involving the circulatory and respiratory systems: Secondary | ICD-10-CM

## 2022-06-06 DIAGNOSIS — Z8249 Family history of ischemic heart disease and other diseases of the circulatory system: Secondary | ICD-10-CM

## 2022-06-06 DIAGNOSIS — I4891 Unspecified atrial fibrillation: Secondary | ICD-10-CM

## 2022-06-06 DIAGNOSIS — I10 Essential (primary) hypertension: Secondary | ICD-10-CM

## 2022-06-06 NOTE — Progress Notes
Date of Service: 06/06/2022    Jerry Price is a 76 y.o. male.       HPI     Jerry Price has been followed for?permanent?atrial fibrillation. hypertension, hyperlipidemia, aortic stenosis and a family history of coronary .?For many years?Jerry Price?has indicated his preference for a strategy of heart rate control and anticoagulation?and has declined cardioversion, antiarrhythmic therapy and cardiac arrhythmia ablation.??He has not been exercising or checking his blood pressure. ?Otherwise, he has been stable from a cardiovascular perspective and reports no angina, congestive symptoms, palpitations, sensation of sustained forceful heart pounding, lightheadedness or syncope. His exercise has been generally stable, although he does not have an actual exercise routine. 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 The patient reports no myalgias, claudication, bleeding abnormalities,?or strokelike symptoms. He reports no recent emergency room visits or hospitalizations. He tolerates?pravastatin but has not tolerated other statins because of myalgias.   Historically, in January 2013 Jerry Price developed paroxysmal atrial fibrillation. He was placed on Pradaxa and on 10/30/2011 he underwent TEE-guided cardioversion with excellent results. In August 2013 he switched from Pradaxa to warfarin because of affordability. He developed recurrence of atrial fibrillation and on 07/09/12 underwent repeat cardioversion and was placed on dronedarone for suppression. In October 2015 he was taken off dronedarone when he was found to be in atrial fibrillation and indicated that he did not want to continue antiarrhythmic therapy or pursue cardioversion or arrhythmia ablation.Jerry Price reports that a skin cancer was removed from his right upper arm in January 2017.?         Vitals:    06/06/22 1012   BP: (!) 140/82   BP Source: Arm, Left Upper   Pulse: 64   SpO2: 95%   O2 Device: None (Room air)   PainSc: Zero   Weight: 114.9 kg (253 lb 6.4 oz)   Height: 177.8 cm (5' 10)     Body mass index is 36.36 kg/m?Jerry Price     Past Medical History  Patient Active Problem List    Diagnosis Date Noted   ? Heart palpitations 10/23/2011   ? Atrial fibrillation (HCC) 10/23/2011   ? Family history of coronary arteriosclerosis 03/22/2009   ? Observation for suspected coronary artery disease (CAD) 03/22/2009     Cardiac surveillance testing due to chest pain       a.  10/97 - Treadmill done @ [facility]: nondiagnostic ST segment changes,            non-ischemic study     ? Chest pain 03/22/2009   ? Hypertension 03/22/2009     Hypertension - diagnosed in 1997     ? Hyperlipemia 03/22/2009     Mixed Hyperlipidemia-(Elev. Trigs & LDL, Low HDL)       a. Homocystinemia, normal LPa       b. Elevated hsCRP     ? History of tobacco use 03/22/2009     History of tobacco use: Quit smoking 10-12 years ago.  Hx: 3 packs/day for 20 years.     ? 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 Metabolic syndrome X 03/22/2009   ? Gout 03/22/2009         Review of Systems   Constitutional: Negative.   HENT: Negative.    Eyes: Negative.    Cardiovascular: Negative.    Respiratory: Negative.  Endocrine: Negative.    Hematologic/Lymphatic: Negative.    Skin: Negative.    Musculoskeletal: Negative.    Gastrointestinal: Negative.    Genitourinary: Negative.    Neurological: Negative.    Psychiatric/Behavioral: Negative.    Allergic/Immunologic: Negative.        Physical Exam  GENERAL: The patient is well developed, well nourished, resting comfortably and in no distress. ?  HEENT: No abnormalities of the visible oro-nasopharynx, conjunctiva or sclera are noted. ?  NECK: There is no jugular venous distension. Carotids are palpable and without bruits. There is no thyroid enlargement. ?  Chest: Lung fields are clear to auscultation. There are no wheezes or crackles. ?  CV: There is an irregular rhythm with an apical rate of?64?BPM?and variable intensity of the first heart sound.  A grade 2/6 to 3/6 systolic ejection murmur is heard best in the right upper sternal border.. There are no diastolic murmurs, gallops or rubs. ?  ABD: The abdomen is soft and supple with normal bowel sounds. There is no hepatosplenomegaly, ascites, tenderness, masses or bruits. ?  Neuro: There are no focal motor defects. Ambulation is normal. Cognitive function appears normal. ?  Ext:?There is trace bipedal?edema?without?evidence of deep vein thrombosis. Peripheral pulses are satisfactory. ?  SKIN:?There are no rashes and no cellulitis  PSYCH:?The patient is calm, rationale and oriented    Cardiovascular Studies  A twelve-lead ECG obtained on 06/06/2022 reveals atrial fibrillation with a heart rate of 62 bpm.  Incomplete right bundle branch block is noted.  Ventricular premature beats or aberrancy.  Possible anteroseptal/anterior myocardial infarction, age old or indeterminant without change from previous ECG obtained on February 01, 2021.  Echo Doppler 02/21/2021:  Interpretation Summary  ?  1. No regional wall motion abnormalities are seen. Overall left ventricular systolic function appears normal. The estimated left ventricular ejection fraction is 60-65%.  2. The right ventricle is not visualized well. ?Limited images suggest mild right ventricular enlargement with normal right ventricular contractility.  3. Severe biatrial enlargement.  4. Mild to moderate aortic valve stenosis.  5. Mild mitral valve regurgitation is noted by Doppler exam.  6. 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  7. No pericardial effusion is seen.  8. The estimated peak systolic PA pressure = 51 mmHg.  9. The cardiac rhythm is irregular suggestive for atrial fibrillation.  Cardiovascular Health Factors  Vitals BP Readings from Last 3 Encounters:   06/06/22 (!) 140/82   08/02/21 134/80   02/21/21 (!) 146/82     Wt Readings from Last 3 Encounters:   06/06/22 114.9 kg (253 lb 6.4 oz)   08/02/21 115.2 kg (254 lb)   02/21/21 115.5 kg (254 lb 9.6 oz)     BMI Readings from Last 3 Encounters:   06/06/22 36.36 kg/m?   08/02/21 36.45 kg/m?   02/21/21 36.53 kg/m?      Smoking Social History     Tobacco Use   Smoking Status Former   ? Years: 37   ? Types: Cigarettes   ? Quit date: 10/15/1991   ? Years since quitting: 30.6   Smokeless Tobacco Never      Lipid Profile Cholesterol   Date Value Ref Range Status   02/05/2021 149  Final     HDL   Date Value Ref Range Status   02/05/2021 44  Final     LDL   Date Value Ref Range Status   02/05/2021  89  Final     Triglycerides   Date Value Ref Range Status   02/05/2021 80  Final      Blood Sugar Hemoglobin A1C   Date Value Ref Range Status   06/28/2003 5.4 4.7 - 6.2 % Final     Glucose   Date Value Ref Range Status   02/05/2021 111 (H) 70 - 105 Final   07/09/2019 120 (H) 70 - 105 Final   07/23/2018 104  Final   07/28/2003 109 70 - 110 MG/DL Final   16/07/9603 540 70 - 110 MG/DL Final          Problems Addressed Today  Encounter Diagnoses   Name Primary?   ? Cardiovascular symptoms Yes   ? Atrial fibrillation, unspecified type (HCC)    ? Hyperlipidemia, unspecified hyperlipidemia type    ? Primary hypertension        Assessment and Plan     Currently Mr. Earll's heart rate appears well controlled and he reports that he has been compliant with his anticoagulation.. Mr. Draughn's CHA2DS2-VASc score is 3 for age and hypertension. The risks and benefits of anticoagulation therapy have been reviewed with the patient. The patient understands that anticoagulation is used to decrease thrombotic or clotting complications associated with atrial fibrillation/flutter, such as stroke and systemic embolization which can be disabling or fatal, but can, on occasion, lead to life-threatening bleeding complications including gastrointestinal and intracranial hemorrhage. The patient wishes to continue with anticoagulation. Anticoagulation options were presented to the patient which included warfarin or one of the newer direct acting oral anticoagulants and the patient wanted to continue taking warfarin. ?I once again talked to Mr. Hesley?about rhythm management for his atrial fibrillation and discussed cardioversion, antiarrhythmic therapy and arrhythmia ablation. ?He did not want to pursue this form of treatment and wanted to continue with heart rate control and anticoagulation. ?I have asked Mr. Iaquinto to continue working with the cardiovascular nurses so that his INR can be monitored and his dose of warfarin adjusted accordingly.  In addition, he knows that he can switch to Eliquis from warfarin anytime he wants.  I have asked the patient to keep a log book of his BP readings and to report BP readings exceeding 130/80 mm Hg.   I have asked him to report his blood pressure readings within 2 months regardless. Alternatives for the treatment of hypercholesterolemia were reviewed the patient. ?He did not want to change to a stronger statin because of difficulty with myalgias experienced with other statin medications.? I have asked Mr. Oborny to obtain a repeat echo Doppler study to reassess his aortic valve stenosis. ?I have asked him to return for follow-up in 6 months time. The total time spent during this interview and exam was 30 minutes.         Current Medications (including today's revisions)  ? amLODIPine (NORVASC) 5 mg tablet TAKE 1 TABLET EVERY DAY   ? ezetimibe (ZETIA) 10 mg tablet Take 1 tablet by mouth once daily   ? GREEN TEA EXTRACT PO Take 1 Tab by mouth Daily.   ? hydroCHLOROthiazide (HYDRODIURIL) 25 mg tablet TAKE 1 TABLET EVERY MORNING   ? losartan (COZAAR) 50 mg tablet TAKE 1 TABLET EVERY DAY   ? metoprolol tartrate (LOPRESSOR) 50 mg tablet TAKE 1 TABLET TWICE DAILY   ? multivitamin (ONE-A-DAY) tablet Take one tablet by mouth daily.   ? pravastatin (PRAVACHOL) 80 mg tablet TAKE 1 TABLET EVERY DAY   ? VIT B COMPLEX NO.10/FOLIC  ACID (B COMPLEX 100 CR PO) Take 1 Tab by mouth Daily.   ? warfarin (COUMADIN) 2 mg tablet TAKE 1 TABLET BY MOUTH ONCE DAILY OR  AS  DIRECTED  BY  CARDIOLOGIST   ? warfarin (COUMADIN) 5 mg tablet Take 1/2 to 1 (one half to one) tablet by mouth once daily as directed by cardiologist.

## 2022-06-19 ENCOUNTER — Encounter: Admit: 2022-06-19 | Discharge: 2022-06-19 | Payer: MEDICARE

## 2022-06-19 MED ORDER — HYDROCHLOROTHIAZIDE 25 MG PO TAB
ORAL_TABLET | ORAL | 3 refills | 28.00000 days | Status: AC
Start: 2022-06-19 — End: ?

## 2022-06-19 MED ORDER — LOSARTAN 50 MG PO TAB
ORAL_TABLET | ORAL | 3 refills | 90.00000 days | Status: AC
Start: 2022-06-19 — End: ?

## 2022-06-21 ENCOUNTER — Encounter: Admit: 2022-06-21 | Discharge: 2022-06-21 | Payer: MEDICARE

## 2022-06-21 DIAGNOSIS — I4891 Unspecified atrial fibrillation: Secondary | ICD-10-CM

## 2022-06-21 LAB — PROTIME INR (PT): INR: 2 — ABNORMAL HIGH (ref 0.89–1.11)

## 2022-06-26 ENCOUNTER — Encounter: Admit: 2022-06-26 | Discharge: 2022-06-26 | Payer: MEDICARE

## 2022-06-26 ENCOUNTER — Ambulatory Visit: Admit: 2022-06-26 | Discharge: 2022-06-26 | Payer: MEDICARE

## 2022-06-26 DIAGNOSIS — I10 Essential (primary) hypertension: Secondary | ICD-10-CM

## 2022-06-26 DIAGNOSIS — I4891 Unspecified atrial fibrillation: Secondary | ICD-10-CM

## 2022-06-26 DIAGNOSIS — R0989 Other specified symptoms and signs involving the circulatory and respiratory systems: Secondary | ICD-10-CM

## 2022-06-26 DIAGNOSIS — E785 Hyperlipidemia, unspecified: Secondary | ICD-10-CM

## 2022-06-26 MED ORDER — PERFLUTREN LIPID MICROSPHERES 1.1 MG/ML IV SUSP
1-10 mL | Freq: Once | INTRAVENOUS | 0 refills | Status: CP | PRN
Start: 2022-06-26 — End: ?
  Administered 2022-06-26: 17:00:00 1.5 mL via INTRAVENOUS

## 2022-07-01 ENCOUNTER — Encounter: Admit: 2022-07-01 | Discharge: 2022-07-01 | Payer: MEDICARE

## 2022-07-01 NOTE — Telephone Encounter
Results and recommendations called to patient.

## 2022-07-01 NOTE — Telephone Encounter
-----   Message from Nehemiah Massed, MD sent at 06/28/2022  2:23 PM CDT -----  Richardson Landry and Kelsey:Mr. Rufener's aortic stenosis appears about the same when compared to his last echo Doppler exam.  His ascending aorta has mildly increased in size.  We will continue to follow.  Please let him know.  Thanks.  SBG  ----- Message -----  From: Nehemiah Massed, MD  Sent: 06/26/2022   6:06 PM CDT  To: Nehemiah Massed, MD

## 2022-07-24 ENCOUNTER — Encounter: Admit: 2022-07-24 | Discharge: 2022-07-24 | Payer: MEDICARE

## 2022-07-24 DIAGNOSIS — I4891 Unspecified atrial fibrillation: Secondary | ICD-10-CM

## 2022-07-24 LAB — PROTIME INR (PT): INR: 2.1 mg/dL — ABNORMAL HIGH (ref 0.89–1.11)

## 2022-08-28 ENCOUNTER — Encounter: Admit: 2022-08-28 | Discharge: 2022-08-28 | Payer: MEDICARE

## 2022-08-28 DIAGNOSIS — I4891 Unspecified atrial fibrillation: Secondary | ICD-10-CM

## 2022-08-28 LAB — PROTIME INR (PT): INR: 1.6 — ABNORMAL HIGH (ref 0.89–1.11)

## 2022-09-04 ENCOUNTER — Encounter: Admit: 2022-09-04 | Discharge: 2022-09-04 | Payer: MEDICARE

## 2022-09-04 DIAGNOSIS — I4891 Unspecified atrial fibrillation: Secondary | ICD-10-CM

## 2022-09-04 LAB — PROTIME INR (PT): INR: 1.7 — ABNORMAL HIGH (ref 0.89–1.11)

## 2022-09-08 ENCOUNTER — Encounter: Admit: 2022-09-08 | Discharge: 2022-09-08 | Payer: MEDICARE

## 2022-09-08 MED ORDER — PRAVASTATIN 80 MG PO TAB
ORAL_TABLET | ORAL | 3 refills | 90.00000 days | Status: AC
Start: 2022-09-08 — End: ?

## 2022-09-11 ENCOUNTER — Encounter: Admit: 2022-09-11 | Discharge: 2022-09-11 | Payer: MEDICARE

## 2022-09-11 DIAGNOSIS — I4891 Unspecified atrial fibrillation: Secondary | ICD-10-CM

## 2022-09-11 LAB — PROTIME INR (PT): INR: 2.1

## 2022-09-13 ENCOUNTER — Encounter: Admit: 2022-09-13 | Discharge: 2022-09-13 | Payer: MEDICARE

## 2022-09-13 MED ORDER — WARFARIN 2 MG PO TAB
ORAL_TABLET | ORAL | 3 refills | 90.00000 days | Status: AC
Start: 2022-09-13 — End: ?

## 2022-09-13 MED ORDER — EZETIMIBE 10 MG PO TAB
ORAL_TABLET | 3 refills | Status: AC
Start: 2022-09-13 — End: ?

## 2022-09-25 ENCOUNTER — Encounter: Admit: 2022-09-25 | Discharge: 2022-09-25 | Payer: MEDICARE

## 2022-09-25 DIAGNOSIS — I4891 Unspecified atrial fibrillation: Secondary | ICD-10-CM

## 2022-09-25 LAB — PROTIME INR (PT): INR: 3 — ABNORMAL HIGH (ref 0.89–1.11)

## 2022-10-09 ENCOUNTER — Encounter: Admit: 2022-10-09 | Discharge: 2022-10-09 | Payer: MEDICARE

## 2022-10-09 DIAGNOSIS — I4891 Unspecified atrial fibrillation: Secondary | ICD-10-CM

## 2022-10-09 LAB — PROTIME INR (PT): INR: 2.9

## 2022-11-05 ENCOUNTER — Encounter: Admit: 2022-11-05 | Discharge: 2022-11-05 | Payer: MEDICARE

## 2022-11-13 ENCOUNTER — Encounter: Admit: 2022-11-13 | Discharge: 2022-11-13 | Payer: MEDICARE

## 2022-11-13 DIAGNOSIS — I4891 Unspecified atrial fibrillation: Secondary | ICD-10-CM

## 2022-11-13 LAB — PROTIME INR (PT): INR: 3.1

## 2022-11-22 ENCOUNTER — Encounter: Admit: 2022-11-22 | Discharge: 2022-11-22 | Payer: MEDICARE

## 2022-11-22 DIAGNOSIS — I4891 Unspecified atrial fibrillation: Secondary | ICD-10-CM

## 2022-11-22 LAB — PROTIME INR (PT): INR: 2.5 — ABNORMAL HIGH (ref 0.89–1.11)

## 2022-12-16 ENCOUNTER — Encounter: Admit: 2022-12-16 | Discharge: 2022-12-16 | Payer: MEDICARE

## 2022-12-16 MED ORDER — METOPROLOL TARTRATE 50 MG PO TAB
ORAL_TABLET | ORAL | 3 refills | 90.00000 days | Status: AC
Start: 2022-12-16 — End: ?

## 2022-12-16 MED ORDER — AMLODIPINE 5 MG PO TAB
ORAL_TABLET | 3 refills | Status: AC
Start: 2022-12-16 — End: ?

## 2022-12-24 ENCOUNTER — Encounter: Admit: 2022-12-24 | Discharge: 2022-12-24 | Payer: MEDICARE

## 2022-12-24 DIAGNOSIS — I4891 Unspecified atrial fibrillation: Secondary | ICD-10-CM

## 2022-12-24 LAB — PROTIME INR (PT): INR: 3.2 — ABNORMAL HIGH (ref 0.89–1.11)

## 2023-01-09 ENCOUNTER — Encounter: Admit: 2023-01-09 | Discharge: 2023-01-09 | Payer: MEDICARE

## 2023-01-09 DIAGNOSIS — I4891 Unspecified atrial fibrillation: Secondary | ICD-10-CM

## 2023-01-09 LAB — PROTIME INR (PT): INR: 3.2 — ABNORMAL HIGH (ref 0.89–1.11)

## 2023-01-17 ENCOUNTER — Encounter: Admit: 2023-01-17 | Discharge: 2023-01-17 | Payer: MEDICARE

## 2023-01-17 DIAGNOSIS — I4891 Unspecified atrial fibrillation: Secondary | ICD-10-CM

## 2023-01-17 LAB — PROTIME INR (PT): INR: 2.3 — ABNORMAL HIGH (ref 0.89–1.11)

## 2023-02-04 ENCOUNTER — Encounter: Admit: 2023-02-04 | Discharge: 2023-02-04 | Payer: MEDICARE

## 2023-02-04 DIAGNOSIS — I4891 Unspecified atrial fibrillation: Secondary | ICD-10-CM

## 2023-02-04 DIAGNOSIS — Z79899 Other long term (current) drug therapy: Secondary | ICD-10-CM

## 2023-02-04 DIAGNOSIS — Z7901 Long term (current) use of anticoagulants: Secondary | ICD-10-CM

## 2023-02-04 LAB — PROTIME INR (PT): INR: 2.2

## 2023-03-12 ENCOUNTER — Encounter: Admit: 2023-03-12 | Discharge: 2023-03-12 | Payer: MEDICARE

## 2023-03-12 DIAGNOSIS — Z7901 Long term (current) use of anticoagulants: Secondary | ICD-10-CM

## 2023-03-12 DIAGNOSIS — I4891 Unspecified atrial fibrillation: Secondary | ICD-10-CM

## 2023-03-12 LAB — PROTIME INR (PT): INR: 2.3

## 2023-04-07 ENCOUNTER — Encounter: Admit: 2023-04-07 | Discharge: 2023-04-07 | Payer: MEDICARE

## 2023-04-10 ENCOUNTER — Encounter: Admit: 2023-04-10 | Discharge: 2023-04-10 | Payer: MEDICARE

## 2023-04-10 DIAGNOSIS — E785 Hyperlipidemia, unspecified: Secondary | ICD-10-CM

## 2023-04-10 DIAGNOSIS — I10 Essential (primary) hypertension: Secondary | ICD-10-CM

## 2023-04-10 DIAGNOSIS — R0989 Other specified symptoms and signs involving the circulatory and respiratory systems: Secondary | ICD-10-CM

## 2023-04-10 DIAGNOSIS — Z8249 Family history of ischemic heart disease and other diseases of the circulatory system: Secondary | ICD-10-CM

## 2023-04-10 DIAGNOSIS — E669 Obesity, unspecified: Secondary | ICD-10-CM

## 2023-04-10 DIAGNOSIS — R002 Palpitations: Secondary | ICD-10-CM

## 2023-04-10 DIAGNOSIS — E8881 Metabolic syndrome: Secondary | ICD-10-CM

## 2023-04-10 DIAGNOSIS — I4891 Unspecified atrial fibrillation: Secondary | ICD-10-CM

## 2023-04-10 NOTE — Progress Notes
Date of Service: 04/10/2023    Jerry Price is a 77 y.o. male.       HPI   [name] has been followed for permanent atrial fibrillation. hypertension, hyperlipidemia, aortic stenosis and a family history of coronary . For many years Jerry Price has indicated his preference for a strategy of heart rate control and anticoagulation and has declined cardioversion, antiarrhythmic therapy and cardiac arrhythmia ablation.  Over the past 6 months he has done a good job checking his blood pressure outside the office and most of his blood pressure readings have been well-controlled and less than 130 over 80 mmHg.  Otherwise, he has been stable from a cardiovascular perspective and reports no angina, congestive symptoms, palpitations, sensation of sustained forceful heart pounding, lightheadedness or syncope. His exercise has been generally stable, although he does not have an actual exercise routine.  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 The patient reports no myalgias, claudication, bleeding abnormalities, or strokelike symptoms. He reports no recent emergency room visits or hospitalizations. He tolerates pravastatin but has not tolerated other statins because of myalgias.   Historically, in January 2013 Jerry Price developed paroxysmal atrial fibrillation. He was placed on Pradaxa and on 10/30/2011 he underwent TEE-guided cardioversion with excellent results. In August 2013 he switched from Pradaxa to warfarin because of affordability. He developed recurrence of atrial fibrillation and on 07/09/12 underwent repeat cardioversion and was placed on dronedarone for suppression. In October 2015 he was taken off dronedarone when he was found to be in atrial fibrillation and indicated that he did not want to continue antiarrhythmic therapy or pursue cardioversion or arrhythmia ablation. Jerry Price reports that a skin cancer was removed from his right upper arm in January 2017.        Vitals:    04/10/23 0850   BP: (!) 158/98   BP Source: Arm, Left Upper   Pulse: 71   SpO2: 99%   O2 Device: None (Room air)   PainSc: Zero   Weight: 112.9 kg (249 lb)   Height: 177.8 cm (5' 10)     Body mass index is 35.73 kg/m?Marland Kitchen     Past Medical History  Patient Active Problem List    Diagnosis Date Noted    Heart palpitations 10/23/2011    Atrial fibrillation (HCC) 10/23/2011    Family history of coronary arteriosclerosis 03/22/2009    Observation for suspected coronary artery disease (CAD) 03/22/2009     Cardiac surveillance testing due to chest pain       a.  10/97 - Treadmill done @ [facility]: nondiagnostic ST segment changes,            non-ischemic study      Chest pain 03/22/2009    Hypertension 03/22/2009     Hypertension - diagnosed in 1997      Hyperlipemia 03/22/2009     Mixed Hyperlipidemia-(Elev. Trigs & LDL, Low HDL)       a. Homocystinemia, normal LPa       b. Elevated hsCRP      History of tobacco use 03/22/2009     History of tobacco use: Quit smoking 10-12 years ago.  Hx: 3 packs/day for 20 years.      ETOH abuse 03/22/2009     ETOH abuse-12 pack beer/day-elevated LFTs on Lipitor 40mg  + ETOH      Metabolic syndrome X 03/22/2009  Gout 03/22/2009         Review of Systems   Constitutional: Negative.   HENT: Negative.     Eyes: Negative.    Cardiovascular:  Positive for dyspnea on exertion and leg swelling.   Respiratory: Negative.     Endocrine: Negative.    Hematologic/Lymphatic: Negative.    Skin: Negative.    Musculoskeletal: Negative.    Gastrointestinal: Negative.    Genitourinary: Negative.    Neurological: Negative.    Psychiatric/Behavioral: Negative.     Allergic/Immunologic: Negative.        Physical Exam  GENERAL: The patient is well developed, well nourished, resting comfortably and in no distress.    HEENT: No abnormalities of the visible oro-nasopharynx, conjunctiva or sclera are noted.    NECK: There is no jugular venous distension. Carotids are palpable and without bruits. There is no thyroid enlargement.    Chest: Lung fields are clear to auscultation. There are no wheezes or crackles.    CV: There is an irregular rhythm with an apical rate of 64 BPM and variable intensity of the first heart sound.  A grade 2/6 to 3/6 systolic ejection murmur is heard best in the right upper sternal border.. There are no diastolic murmurs, gallops or rubs.    ABD: The abdomen is soft and supple with normal bowel sounds. There is no hepatosplenomegaly, ascites, tenderness, masses or bruits.    Neuro: There are no focal motor defects. Ambulation is normal. Cognitive function appears normal.    Ext: There is trace to 1+ bipedal edema without evidence of deep vein thrombosis. Peripheral pulses are satisfactory.    SKIN: There are no rashes and no cellulitis  PSYCH: The patient is calm, rationale and oriented    Cardiovascular Studies  A twelve-lead ECG was obtained on 04/10/2023 reveals atrial fibrillation with a heart rate of 56 bpm.  Incomplete right bundle branch block is noted.  There is evidence for anterior/anteroseptal myocardial infarction, age old or undetermined.  A twelve-lead ECG obtained on 06/06/2022 reveals atrial fibrillation with a heart rate of 62 bpm.  Incomplete right bundle branch block is noted.  Ventricular premature beats or aberrancy.  Possible anteroseptal/anterior myocardial infarction, age old or indeterminant without change from previous ECG obtained on February 01, 2021.     Echo Doppler 06/26/2022:  Interpretation Summary  No regional wall motion abnormalities are seen. Overall left ventricular systolic function appears normal. The estimated left ventricular ejection fraction is 60%.   The cardiac rhythm is irregular suggestive for atrial fibrillation.   Mild right ventricular enlargement with normal right ventricular systolic function.  Severe biatrial enlargement.  The aortic valve is not visualized well, although the aortic Doppler exam is suggestive for mild to moderate aortic valve stenosis.  Mild mitral valve regurgitation and mild tricuspid valve regurgitation are noted by Doppler exam.  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  No pericardial effusion is seen.  On the prior echo Doppler exam obtained on 02/21/2021, the proximal ascending aorta measured 4.3 cm in diameter.  Otherwise no significant change is noted from the prior study.    Cardiovascular Health Factors  Vitals BP Readings from Last 3 Encounters:   04/10/23 (!) 158/98   06/26/22 130/78   06/06/22 (!) 140/82     Wt Readings from Last 3 Encounters:   04/10/23 112.9 kg (249 lb)   06/26/22 113.9 kg (251 lb)   06/06/22 114.9  kg (253 lb 6.4 oz)     BMI Readings from Last 3 Encounters:   04/10/23 35.73 kg/m?   06/26/22 36.01 kg/m?   06/06/22 36.36 kg/m?      Smoking Social History     Tobacco Use   Smoking Status Former    Current packs/day: 0.00    Types: Cigarettes    Start date: 10/14/1938    Quit date: 10/15/1991    Years since quitting: 31.5   Smokeless Tobacco Never      Lipid Profile Cholesterol   Date Value Ref Range Status   02/05/2021 149  Final     HDL   Date Value Ref Range Status   02/05/2021 44  Final     LDL   Date Value Ref Range Status   02/05/2021 89  Final     Triglycerides   Date Value Ref Range Status   02/05/2021 80  Final      Blood Sugar Hemoglobin A1C   Date Value Ref Range Status   06/28/2003 5.4 4.7 - 6.2 % Final     Glucose   Date Value Ref Range Status   02/05/2021 111 (H) 70 - 105 Final   07/09/2019 120 (H) 70 - 105 Final   07/23/2018 104  Final   07/28/2003 109 70 - 110 MG/DL Final   16/07/9603 540 70 - 110 MG/DL Final          Problems Addressed Today  Encounter Diagnoses   Name Primary?    Cardiovascular symptoms Yes    Atrial fibrillation, unspecified type (HCC)     Primary hypertension     Hyperlipidemia, unspecified hyperlipidemia type     Heart palpitations        Assessment and Plan     Currently Jerry Price's heart rate appears well controlled and he reports that he has been compliant with his anticoagulation.. Jerry Price CHA2DS2-VASc score is 3 for age and hypertension. The risks and benefits of anticoagulation therapy have been reviewed with the patient. The patient understands that anticoagulation is used to decrease thrombotic or clotting complications associated with atrial fibrillation/flutter, such as stroke and systemic embolization which can be disabling or fatal, but can, on occasion, lead to life-threatening bleeding complications including gastrointestinal and intracranial hemorrhage. The patient wishes to continue with anticoagulation. Anticoagulation options were presented to the patient which included warfarin or one of the newer direct acting oral anticoagulants and the patient wanted to continue taking warfarin.  I have asked Jerry Price to continue working with the cardiovascular nurses so that his INR can be monitored and his dose of warfarin adjusted accordingly.  In addition, he knows that he can switch to Eliquis from warfarin anytime he wants.  I have asked the patient to keep a log book of his BP readings and to report BP readings exceeding 130/80 mm Hg.   Alternatives for the treatment of hypercholesterolemia were reviewed the patient.  He did not want to change to a stronger statin because of difficulty with myalgias experienced with other statin medications.  Jerry Price admits that it has been sometime since he has had a complete evaluation by his primary care physician.  I strongly encouraged that he schedule an appointment with his primary care physician.  I have also asked him to remain compliant with other maintenance examinations, for example, with his dermatologist.  I have asked him to return for follow-up in 6 months time. The total time spent during this interview and exam was  30 minutes. Current Medications (including today's revisions)   amLODIPine (NORVASC) 5 mg tablet TAKE 1 TABLET EVERY DAY    ezetimibe (ZETIA) 10 mg tablet Take 1 tablet by mouth once daily    GREEN TEA EXTRACT PO Take 1 Tab by mouth Daily.    hydroCHLOROthiazide (HYDRODIURIL) 25 mg tablet TAKE 1 TABLET EVERY MORNING    losartan (COZAAR) 50 mg tablet TAKE 1 TABLET EVERY DAY    metoprolol tartrate (LOPRESSOR) 50 mg tablet TAKE 1 TABLET TWICE DAILY    multivitamin (ONE-A-DAY) tablet Take one tablet by mouth daily.    pravastatin (PRAVACHOL) 80 mg tablet TAKE 1 TABLET EVERY DAY    VIT B COMPLEX NO.10/FOLIC ACID (B COMPLEX 100 CR PO) Take 1 Tab by mouth Daily.    warfarin (COUMADIN) 2 mg tablet TAKE 1 TABLET BY MOUTH ONCE DAILY OR  AS  DIRECTED  BY  CARDIOLOGIST    warfarin (COUMADIN) 5 mg tablet Take 1/2 to 1 (one half to one) tablet by mouth once daily as directed by cardiologist.

## 2023-04-23 ENCOUNTER — Encounter: Admit: 2023-04-23 | Discharge: 2023-04-23 | Payer: MEDICARE

## 2023-04-23 DIAGNOSIS — R002 Palpitations: Secondary | ICD-10-CM

## 2023-04-23 DIAGNOSIS — I4891 Unspecified atrial fibrillation: Secondary | ICD-10-CM

## 2023-04-23 DIAGNOSIS — E785 Hyperlipidemia, unspecified: Secondary | ICD-10-CM

## 2023-04-23 DIAGNOSIS — R0989 Other specified symptoms and signs involving the circulatory and respiratory systems: Secondary | ICD-10-CM

## 2023-04-23 DIAGNOSIS — I10 Essential (primary) hypertension: Secondary | ICD-10-CM

## 2023-04-23 DIAGNOSIS — Z7901 Long term (current) use of anticoagulants: Secondary | ICD-10-CM

## 2023-04-23 LAB — BASIC METABOLIC PANEL
BLD UREA NITROGEN: 11
CALCIUM: 9.2
CHLORIDE: 98 FL (ref 7–11)
CO2: 24
CREATININE: 0.8
GFR ESTIMATED: 91
GLUCOSE,PANEL: 115 — ABNORMAL HIGH (ref 70–105)
POTASSIUM: 3.9 K/UL (ref 150–400)
SODIUM: 133 % — ABNORMAL LOW (ref 135–145)

## 2023-04-23 LAB — LIPID PROFILE: CHOLESTEROL: 149 pg (ref 26–34)

## 2023-04-23 LAB — ALT (SGPT): ALT: 24 g/dL (ref 32.0–36.0)

## 2023-04-23 LAB — PROTIME INR (PT): INR: 1.8

## 2023-05-07 ENCOUNTER — Encounter: Admit: 2023-05-07 | Discharge: 2023-05-07 | Payer: MEDICARE

## 2023-05-07 NOTE — Telephone Encounter
Hester Mates, MD  Florene Route, RN  Caller: Unspecified (Today,  9:24 AM)  Adelina Mings: Mr. Cockerell will have to accept the risk of holding his warfarin if required for procedure.  A CHA2DS2-VASc risk of 3 does not warrant bridging anticoagulation.  My only recommendation is for him to hold his warfarin for as short of a period as possible and that he restart his warfarin as soon as his bleeding risk is not prohibitive.  Thanks.  SBG        Recommendations faxed to Dr. Isaac Bliss office at [phone].

## 2023-05-07 NOTE — Telephone Encounter
Received a call from [facility] with Dr. Isaac Bliss office stating that they are wanting Dr. Wesley Blas recommendations about holding Warfarin for 5 days prior to procedure for excision of a skin lesion.       Patient is on Warfarin for Atrial Fibrillation. Patient is a CHADSVASC sore of 3 for age and HTN. Called and spoke to patient. Patient is doing well and denies any new cardiac symptoms.    Last OV with Dr. Arna Medici 04/10/23      Will route to Dr. Arna Medici for his review and recommendations.

## 2023-06-06 ENCOUNTER — Encounter: Admit: 2023-06-06 | Discharge: 2023-06-06 | Payer: MEDICARE

## 2023-06-06 DIAGNOSIS — I4891 Unspecified atrial fibrillation: Secondary | ICD-10-CM

## 2023-06-06 DIAGNOSIS — Z7901 Long term (current) use of anticoagulants: Secondary | ICD-10-CM

## 2023-06-06 LAB — PROTIME INR (PT): INR: 1.6 — ABNORMAL LOW

## 2023-06-13 ENCOUNTER — Encounter: Admit: 2023-06-13 | Discharge: 2023-06-13 | Payer: MEDICARE

## 2023-06-13 DIAGNOSIS — I4891 Unspecified atrial fibrillation: Secondary | ICD-10-CM

## 2023-06-13 DIAGNOSIS — Z7901 Long term (current) use of anticoagulants: Secondary | ICD-10-CM

## 2023-06-13 LAB — PROTIME INR (PT): INR: 1.9 U/L — ABNORMAL HIGH (ref 0.89–1.11)

## 2023-06-27 ENCOUNTER — Encounter: Admit: 2023-06-27 | Discharge: 2023-06-27 | Payer: MEDICARE

## 2023-06-27 DIAGNOSIS — Z7901 Long term (current) use of anticoagulants: Secondary | ICD-10-CM

## 2023-06-27 DIAGNOSIS — I4891 Unspecified atrial fibrillation: Secondary | ICD-10-CM

## 2023-06-27 LAB — PROTIME INR (PT): INR: 2.6 — ABNORMAL HIGH (ref 0.89–1.11)

## 2023-07-30 ENCOUNTER — Encounter: Admit: 2023-07-30 | Discharge: 2023-07-30 | Payer: MEDICARE

## 2023-07-30 DIAGNOSIS — Z7901 Long term (current) use of anticoagulants: Secondary | ICD-10-CM

## 2023-07-30 DIAGNOSIS — I4891 Unspecified atrial fibrillation: Secondary | ICD-10-CM

## 2023-07-30 LAB — PROTIME INR (PT): INR: 2.9 10*3/uL — ABNORMAL LOW (ref 1.0–4.8)

## 2023-08-26 ENCOUNTER — Encounter: Admit: 2023-08-26 | Discharge: 2023-08-26 | Payer: MEDICARE

## 2023-08-26 MED ORDER — PRAVASTATIN 80 MG PO TAB
ORAL_TABLET | ORAL | 1 refills | 90.00000 days | Status: AC
Start: 2023-08-26 — End: ?

## 2023-08-26 MED ORDER — LOSARTAN 50 MG PO TAB
ORAL_TABLET | ORAL | 1 refills | 90.00000 days | Status: AC
Start: 2023-08-26 — End: ?

## 2023-08-26 MED ORDER — HYDROCHLOROTHIAZIDE 25 MG PO TAB
ORAL_TABLET | ORAL | 1 refills | 28.00000 days | Status: AC
Start: 2023-08-26 — End: ?

## 2023-09-01 ENCOUNTER — Encounter: Admit: 2023-09-01 | Discharge: 2023-09-01 | Payer: MEDICARE

## 2023-09-01 MED ORDER — EZETIMIBE 10 MG PO TAB
ORAL_TABLET | 1 refills | Status: AC
Start: 2023-09-01 — End: ?

## 2023-09-01 MED ORDER — WARFARIN 2 MG PO TAB
ORAL_TABLET | ORAL | 1 refills | 90.00000 days | Status: AC
Start: 2023-09-01 — End: ?

## 2023-09-05 ENCOUNTER — Encounter: Admit: 2023-09-05 | Discharge: 2023-09-05 | Payer: MEDICARE

## 2023-09-05 DIAGNOSIS — I4891 Unspecified atrial fibrillation: Secondary | ICD-10-CM

## 2023-09-05 DIAGNOSIS — Z7901 Long term (current) use of anticoagulants: Secondary | ICD-10-CM

## 2023-09-05 LAB — PROTIME INR (PT): INR: 2.8 — ABNORMAL HIGH (ref 0.89–1.11)

## 2023-10-21 ENCOUNTER — Encounter: Admit: 2023-10-21 | Discharge: 2023-10-21 | Payer: MEDICARE

## 2023-10-21 NOTE — Telephone Encounter
 INR Overdue. Called and left message requesting pt to have INR drawn at earliest convenience.  Left callback number for any questions or concerns.

## 2023-10-24 ENCOUNTER — Encounter: Admit: 2023-10-24 | Discharge: 2023-10-24 | Payer: MEDICARE

## 2023-10-24 DIAGNOSIS — Z7901 Long term (current) use of anticoagulants: Secondary | ICD-10-CM

## 2023-10-24 DIAGNOSIS — I4891 Unspecified atrial fibrillation: Secondary | ICD-10-CM

## 2023-10-24 LAB — PROTIME INR (PT): INR: 3

## 2023-10-28 ENCOUNTER — Encounter: Admit: 2023-10-28 | Discharge: 2023-10-28 | Payer: MEDICARE

## 2023-11-24 ENCOUNTER — Encounter: Admit: 2023-11-24 | Discharge: 2023-11-24 | Payer: MEDICARE

## 2023-11-24 DIAGNOSIS — I4891 Unspecified atrial fibrillation: Secondary | ICD-10-CM

## 2023-12-21 ENCOUNTER — Encounter: Admit: 2023-12-21 | Discharge: 2023-12-21 | Payer: MEDICARE

## 2023-12-22 ENCOUNTER — Encounter: Admit: 2023-12-22 | Discharge: 2023-12-22 | Payer: MEDICARE

## 2023-12-22 MED ORDER — EZETIMIBE 10 MG PO TAB
10 mg | ORAL_TABLET | Freq: Every day | ORAL | 1 refills | Status: AC
Start: 2023-12-22 — End: ?

## 2023-12-22 MED ORDER — WARFARIN 2 MG PO TAB
ORAL_TABLET | ORAL | 1 refills | 90.00000 days | Status: AC
Start: 2023-12-22 — End: ?

## 2023-12-31 ENCOUNTER — Encounter: Admit: 2023-12-31 | Discharge: 2023-12-31 | Payer: MEDICARE

## 2023-12-31 DIAGNOSIS — I4891 Unspecified atrial fibrillation: Secondary | ICD-10-CM

## 2023-12-31 DIAGNOSIS — Z7901 Long term (current) use of anticoagulants: Secondary | ICD-10-CM

## 2023-12-31 LAB — PROTIME INR (PT): INR: 2.3 — ABNORMAL HIGH (ref 0.89–1.11)

## 2024-01-19 ENCOUNTER — Encounter: Admit: 2024-01-19 | Discharge: 2024-01-19 | Payer: MEDICARE

## 2024-01-19 DIAGNOSIS — I4891 Unspecified atrial fibrillation: Secondary | ICD-10-CM

## 2024-01-19 DIAGNOSIS — Z7901 Long term (current) use of anticoagulants: Secondary | ICD-10-CM

## 2024-01-19 LAB — PROTIME INR (PT): INR: 1.6 — ABNORMAL HIGH (ref 0.89–1.11)

## 2024-01-26 ENCOUNTER — Encounter: Admit: 2024-01-26 | Discharge: 2024-01-26 | Payer: MEDICARE

## 2024-01-26 DIAGNOSIS — I4891 Unspecified atrial fibrillation: Secondary | ICD-10-CM

## 2024-01-26 DIAGNOSIS — Z7901 Long term (current) use of anticoagulants: Secondary | ICD-10-CM

## 2024-01-26 LAB — PROTIME INR (PT): INR: 2.6

## 2024-01-30 ENCOUNTER — Encounter: Admit: 2024-01-30 | Discharge: 2024-01-30 | Payer: MEDICARE

## 2024-01-30 DIAGNOSIS — I4891 Unspecified atrial fibrillation: Secondary | ICD-10-CM

## 2024-01-30 DIAGNOSIS — Z7901 Long term (current) use of anticoagulants: Secondary | ICD-10-CM

## 2024-01-30 LAB — PROTIME INR (PT): INR: 3.2

## 2024-02-03 ENCOUNTER — Encounter: Admit: 2024-02-03 | Discharge: 2024-02-03 | Payer: MEDICARE

## 2024-02-03 DIAGNOSIS — I4891 Unspecified atrial fibrillation: Secondary | ICD-10-CM

## 2024-02-03 DIAGNOSIS — Z7901 Long term (current) use of anticoagulants: Secondary | ICD-10-CM

## 2024-02-03 LAB — PROTIME INR (PT): INR: 3.6 — ABNORMAL HIGH (ref 0.89–1.11)

## 2024-02-06 ENCOUNTER — Encounter: Admit: 2024-02-06 | Discharge: 2024-02-06 | Payer: MEDICARE

## 2024-02-06 DIAGNOSIS — I4891 Unspecified atrial fibrillation: Secondary | ICD-10-CM

## 2024-02-06 DIAGNOSIS — Z7901 Long term (current) use of anticoagulants: Secondary | ICD-10-CM

## 2024-02-06 LAB — PROTIME INR (PT): INR: 2.7 — ABNORMAL HIGH (ref 0.89–1.11)

## 2024-02-10 ENCOUNTER — Encounter: Admit: 2024-02-10 | Discharge: 2024-02-10 | Payer: MEDICARE

## 2024-02-10 DIAGNOSIS — I4891 Unspecified atrial fibrillation: Secondary | ICD-10-CM

## 2024-02-10 DIAGNOSIS — Z7901 Long term (current) use of anticoagulants: Secondary | ICD-10-CM

## 2024-02-10 LAB — PROTIME INR (PT): INR: 1.7

## 2024-02-13 ENCOUNTER — Encounter: Admit: 2024-02-13 | Discharge: 2024-02-13 | Payer: MEDICARE

## 2024-02-13 DIAGNOSIS — I4891 Unspecified atrial fibrillation: Secondary | ICD-10-CM

## 2024-02-13 DIAGNOSIS — Z7901 Long term (current) use of anticoagulants: Secondary | ICD-10-CM

## 2024-02-13 LAB — PROTIME INR (PT): INR: 1.5 — ABNORMAL HIGH (ref 0.89–1.11)

## 2024-02-13 NOTE — Progress Notes
 02/13/2024 12:07 PM   Spoke with Jerry Price regarding INR 1.5 (goal 2-3).  Verified last 3 days warfarin dose.  Stated has 2 doses of doxycycline to take (1 tonight & 1 in the morning); per [facility] there is a moderate interaction w/ warfarin.  Instructed to take warfarin 3 mg tonight then 2 mg all other days w/ recheck on 02/17/24.

## 2024-02-17 ENCOUNTER — Encounter: Admit: 2024-02-17 | Discharge: 2024-02-17 | Payer: MEDICARE

## 2024-02-17 DIAGNOSIS — Z7901 Long term (current) use of anticoagulants: Secondary | ICD-10-CM

## 2024-02-17 DIAGNOSIS — I4891 Unspecified atrial fibrillation: Secondary | ICD-10-CM

## 2024-02-17 LAB — PROTIME INR (PT): INR: 1.8

## 2024-02-24 ENCOUNTER — Encounter: Admit: 2024-02-24 | Discharge: 2024-02-24 | Payer: MEDICARE

## 2024-02-24 DIAGNOSIS — I4891 Unspecified atrial fibrillation: Secondary | ICD-10-CM

## 2024-03-02 ENCOUNTER — Encounter: Admit: 2024-03-02 | Discharge: 2024-03-02 | Payer: MEDICARE

## 2024-03-02 DIAGNOSIS — Z7901 Long term (current) use of anticoagulants: Secondary | ICD-10-CM

## 2024-03-02 DIAGNOSIS — I4891 Unspecified atrial fibrillation: Secondary | ICD-10-CM

## 2024-03-02 LAB — PROTIME INR (PT): INR: 2

## 2024-03-12 ENCOUNTER — Encounter: Admit: 2024-03-12 | Discharge: 2024-03-12 | Payer: MEDICARE

## 2024-03-12 MED ORDER — LOSARTAN 50 MG PO TAB
50 mg | ORAL_TABLET | Freq: Every day | ORAL | 0 refills | 90.00000 days | Status: AC
Start: 2024-03-12 — End: ?

## 2024-03-12 MED ORDER — PRAVASTATIN 80 MG PO TAB
80 mg | ORAL_TABLET | Freq: Every day | ORAL | 0 refills | 90.00000 days | Status: AC
Start: 2024-03-12 — End: ?

## 2024-03-12 MED ORDER — HYDROCHLOROTHIAZIDE 25 MG PO TAB
25 mg | ORAL_TABLET | ORAL | 0 refills | 28.00000 days | Status: AC
Start: 2024-03-12 — End: ?

## 2024-03-18 ENCOUNTER — Encounter: Admit: 2024-03-18 | Discharge: 2024-03-18 | Payer: MEDICARE

## 2024-03-18 DIAGNOSIS — I4891 Unspecified atrial fibrillation: Secondary | ICD-10-CM

## 2024-03-18 DIAGNOSIS — Z7901 Long term (current) use of anticoagulants: Secondary | ICD-10-CM

## 2024-03-18 LAB — PROTIME INR (PT): INR: 2.7

## 2024-04-01 ENCOUNTER — Encounter: Admit: 2024-04-01 | Discharge: 2024-04-01 | Payer: MEDICARE

## 2024-04-01 DIAGNOSIS — Z7901 Long term (current) use of anticoagulants: Secondary | ICD-10-CM

## 2024-04-01 DIAGNOSIS — I4891 Unspecified atrial fibrillation: Secondary | ICD-10-CM

## 2024-04-01 LAB — PROTIME INR (PT): INR: 2.1

## 2024-04-12 ENCOUNTER — Encounter: Admit: 2024-04-12 | Discharge: 2024-04-12 | Payer: MEDICARE

## 2024-04-15 ENCOUNTER — Encounter: Admit: 2024-04-15 | Discharge: 2024-04-15 | Payer: MEDICARE

## 2024-04-15 ENCOUNTER — Ambulatory Visit: Admit: 2024-04-15 | Discharge: 2024-04-15 | Payer: MEDICARE

## 2024-04-15 DIAGNOSIS — I10 Essential (primary) hypertension: Secondary | ICD-10-CM

## 2024-04-15 DIAGNOSIS — Z8249 Family history of ischemic heart disease and other diseases of the circulatory system: Secondary | ICD-10-CM

## 2024-04-15 DIAGNOSIS — E785 Hyperlipidemia, unspecified: Secondary | ICD-10-CM

## 2024-04-15 DIAGNOSIS — R0989 Other specified symptoms and signs involving the circulatory and respiratory systems: Principal | ICD-10-CM

## 2024-04-15 DIAGNOSIS — I4891 Unspecified atrial fibrillation: Secondary | ICD-10-CM

## 2024-04-15 NOTE — Patient Instructions
Thank you for visiting our office today.    We would like to make the following medication adjustments:  NONE       Otherwise continue the same medications as you have been doing.          We will be pursuing the following tests after your appointment today:       Orders Placed This Encounter    ECG 12-LEAD    2D + DOPPLER ECHO         We will plan to see you back in 6 months.  Please call us in the meantime with any questions or concerns.        Please allow 5-7 business days for our providers to review your results. All normal results will go to MyChart. If you do not have Mychart, it is strongly recommended to get this so you can easily view all your results. If you do not have mychart, we will attempt to call you once with normal lab and testing results. If we cannot reach you by phone with normal results, we will send you a letter.  If you have not heard the results of your testing after one week please give [place] a call.       Your Cardiovascular Medicine [place] Team Brett Canales, Pilar Jarvis, Shawna Orleans, and [place])  phone number is [phone].

## 2024-04-15 NOTE — Progress Notes
 Date of Service: 04/15/2024    Jerry Price is a 78 y.o. male.       HPI   [name] has been followed for permanent atrial fibrillation. hypertension, hyperlipidemia, aortic stenosis and a family history of coronary . For many years [name] has indicated his preference for a strategy of heart rate control and anticoagulation and has declined cardioversion, antiarrhythmic therapy and cardiac arrhythmia ablation.  His metoprolol  to 25 mg twice daily because of hypotension.  Otherwise, he has been stable from a cardiovascular perspective and reports no angina, congestive symptoms, palpitations, sensation of sustained forceful heart pounding, lightheadedness or syncope. His exercise has been generally stable, although he does not have an actual exercise routine.  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 The patient reports no myalgias, claudication, bleeding abnormalities, or strokelike symptoms. He reports no recent emergency room visits or hospitalizations. He tolerates pravastatin  but has not tolerated other statins because of myalgias.   Historically, in January 2013 [name] developed paroxysmal atrial fibrillation. He was placed on Pradaxa and on 10/30/2011 he underwent TEE-guided cardioversion with excellent results. In August 2013 he switched from Pradaxa to warfarin because of affordability. He developed recurrence of atrial fibrillation and on 07/09/12 underwent repeat cardioversion and was placed on dronedarone for suppression. In October 2015 he was taken off dronedarone when he was found to be in atrial fibrillation and indicated that he did not want to continue antiarrhythmic therapy or pursue cardioversion or arrhythmia ablation. [name] reports that a skin cancer was removed from his right upper arm in January 2017.            Vitals:    04/15/24 1023   BP: (!) 154/89   BP Source: Arm, Left Upper   Pulse: 58   SpO2: 97%   O2 Device: None (Room air)   PainSc: Zero   Weight: 110.5 kg (243 lb 9.6 oz)   Height: 172.7 cm (5' 8)     Body mass index is 37.04 kg/m?SABRA     Past Medical History  Patient Active Problem List    Diagnosis Date Noted    Heart palpitations 10/23/2011    Atrial fibrillation (CMS-HCC) 10/23/2011    Family history of coronary arteriosclerosis 03/22/2009    Observation for suspected coronary artery disease (CAD) 03/22/2009     Cardiac surveillance testing due to chest pain       a.  10/97 - Treadmill done @ [facility]: nondiagnostic ST segment changes,            non-ischemic study      Chest pain 03/22/2009    Hypertension 03/22/2009     Hypertension - diagnosed in 1997      Hyperlipemia 03/22/2009     Mixed Hyperlipidemia-(Elev. Trigs & LDL, Low HDL)       a. Homocystinemia, normal LPa       b. Elevated hsCRP      History of tobacco use 03/22/2009     History of tobacco use: Quit smoking 10-12 years ago.  Hx: 3 packs/day for 20 years.      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  HENT: Negative.     Eyes: Negative.    Cardiovascular: Negative.    Respiratory: Negative.     Endocrine: Negative.    Hematologic/Lymphatic: Negative.    Skin: Negative.    Musculoskeletal: Negative.    Gastrointestinal: Negative.    Genitourinary: Negative.    Neurological: Negative.    Psychiatric/Behavioral: Negative.     Allergic/Immunologic: Negative.      Physical Exam  GENERAL: The patient is well developed, well nourished, resting comfortably and in no distress.    HEENT: No abnormalities of the visible oro-nasopharynx, conjunctiva or sclera are noted.    NECK: There is no jugular venous distension. Carotids are palpable and without bruits. There is no thyroid enlargement.    Chest: Lung fields are clear to auscultation. There are no wheezes or crackles.    CV: There is an irregular rhythm with an apical rate of 52 BPM and variable intensity of the first heart sound.  A grade 2/6 to 3/6 systolic ejection murmur is heard best in the right upper sternal border. There are no diastolic murmurs, gallops or rubs.    ABD: The abdomen is soft and supple with normal bowel sounds. There is no hepatosplenomegaly, ascites, tenderness, masses or bruits.    Neuro: There are no focal motor defects. Ambulation is normal. Cognitive function appears normal.    Ext: There is trace to 1-2+ bipedal edema without evidence of deep vein thrombosis. Peripheral pulses are satisfactory.    SKIN: There are no rashes and no cellulitis.  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  PSYCH: The patient is calm, rationale and oriented    Cardiovascular Studies  A twelve-lead ECG was obtained on 04/15/2024 reveals atrial fibrillation with a heart rate of 50 bpm.  Incomplete right bundle branch block is noted.  Echo Doppler 06/26/2022:  Interpretation Summary  No regional wall motion abnormalities are seen. Overall left ventricular systolic function appears normal. The estimated left ventricular ejection fraction is 60%.   The cardiac rhythm is irregular suggestive for atrial fibrillation.   Mild right ventricular enlargement with normal right ventricular systolic function.  Severe biatrial enlargement.  The aortic valve is not visualized well, although the aortic Doppler exam is suggestive for mild to moderate aortic valve stenosis.  Mild mitral valve regurgitation and mild tricuspid valve regurgitation are noted by Doppler exam.  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  No pericardial effusion is seen.  On the prior echo Doppler exam obtained on 02/21/2021, the proximal ascending aorta measured 4.3 cm in diameter.  Otherwise no significant change is noted from the prior study.  Cardiovascular Health Factors  Vitals BP Readings from Last 3 Encounters:   04/15/24 (!) 154/89   04/10/23 (!) 158/98   06/26/22 130/78     Wt Readings from Last 3 Encounters:   04/15/24 110.5 kg (243 lb 9.6 oz)   04/10/23 112.9 kg (249 lb)   06/26/22 113.9 kg (251 lb)     BMI Readings from Last 3 Encounters:   04/15/24 37.04 kg/m?   04/10/23 35.73 kg/m?   06/26/22 36.01 kg/m?      Smoking Social History     Tobacco Use   Smoking Status Former    Current packs/day: 0.00    Types: Cigarettes    Quit date: 10/15/1991    Years since quitting: 32.5   Smokeless Tobacco Never      Lipid Profile  Cholesterol   Date Value Ref Range Status   04/23/2023 149  Final     HDL   Date Value Ref Range Status   04/23/2023 46  Final     LDL   Date Value Ref Range Status   04/23/2023 94  Final     Triglycerides   Date Value Ref Range Status   04/23/2023 49  Final      Blood Sugar Hemoglobin A1C   Date Value Ref Range Status   06/28/2003 5.4 4.7 - 6.2 % Final     Glucose   Date Value Ref Range Status   01/14/2024 101  Final   04/23/2023 115 (H) 70 - 105 Final   02/05/2021 111 (H) 70 - 105 Final          Problems Addressed Today  Encounter Diagnoses   Name Primary?    Cardiovascular symptoms Yes    Atrial fibrillation, unspecified type (CMS-HCC)     Primary hypertension     Hyperlipidemia, unspecified hyperlipidemia type     Family history of coronary arteriosclerosis        Assessment and Plan   [name] is mild bradycardia associated with his atrial fibrillation.  I spoke with him about discontinuing his metoprolol  but he reports that he is feeling well and wanted to continue his current dose.  I have asked him to monitor his blood pressure and heart rate report any significant bradycardia, hypotension or hypertension.  [name]'s CHA2DS2-VASc score is 3 for age and hypertension. The risks and benefits of anticoagulation therapy have been reviewed with the patient. The patient understands that anticoagulation is used to decrease thrombotic or clotting complications associated with atrial fibrillation/flutter, such as stroke and systemic embolization which can be disabling or fatal, but can, on occasion, lead to life-threatening bleeding complications including gastrointestinal and intracranial hemorrhage. The patient wishes to continue with anticoagulation. Anticoagulation options were presented to the patient which included warfarin or one of the newer direct acting oral anticoagulants and the patient wanted to continue taking warfarin.  I have asked [name] to continue working with the cardiovascular nurses so that his INR can be monitored and his dose of warfarin adjusted accordingly.  In addition, he knows that he can switch to Eliquis from warfarin anytime he wants.  I have asked the patient to keep a log book of his BP readings and to report BP readings exceeding 130/80 mm Hg.   Alternatives for the treatment of hypercholesterolemia were reviewed the patient.  He did not want to change to a stronger statin because of difficulty with myalgias experienced with other statin medications.  I have also asked him to remain compliant with other maintenance examinations, for example, with his dermatologist.  I have asked [name] to obtain a repeat echo Doppler study to reassess his aortic valve stenosis and aortic dilatation.  I have asked him to return for follow-up in 6 months time. The total time spent during this interview and exam was 30 minutes.         Current Medications (including today's revisions)   amLODIPine  (NORVASC ) 5 mg tablet TAKE 1 TABLET EVERY DAY    ezetimibe  (ZETIA ) 10 mg tablet Take one tablet by mouth daily.    GREEN TEA EXTRACT PO Take 1 Tab by mouth Daily. (Patient not taking: Reported on 04/15/2024)    hydroCHLOROthiazide  (HYDRODIURIL ) 25 mg tablet TAKE 1 TABLET EVERY MORNING    losartan  (COZAAR ) 50 mg tablet TAKE 1 TABLET EVERY  DAY    metoprolol  tartrate (LOPRESSOR ) 50 mg tablet TAKE 1 TABLET TWICE DAILY (Patient taking differently: one-half tablet twice daily.)    multivitamin (ONE-A-DAY) tablet Take one tablet by mouth daily.    pravastatin  (PRAVACHOL ) 80 mg tablet TAKE 1 TABLET EVERY DAY    VIT B COMPLEX NO.10/FOLIC ACID (B COMPLEX 100 CR PO) Take 1 Tab by mouth Daily.    warfarin (COUMADIN ) 2 mg tablet TAKE 1 TABLET BY MOUTH ONCE DAILY **TAKE  AS  DIRECTED  BY  CARDIOLOGIST*    warfarin (COUMADIN ) 5 mg tablet Take 1/2 to 1 (one half to one) tablet by mouth once daily as directed by cardiologist.

## 2024-05-05 ENCOUNTER — Encounter: Admit: 2024-05-05 | Discharge: 2024-05-05 | Payer: MEDICARE

## 2024-05-11 ENCOUNTER — Encounter: Admit: 2024-05-11 | Discharge: 2024-05-11 | Payer: MEDICARE

## 2024-05-11 DIAGNOSIS — I4891 Unspecified atrial fibrillation: Principal | ICD-10-CM

## 2024-05-11 DIAGNOSIS — Z7901 Long term (current) use of anticoagulants: Secondary | ICD-10-CM

## 2024-05-11 LAB — PROTIME INR (PT): INR: 1.7 — ABNORMAL HIGH (ref 0.89–1.11)

## 2024-05-18 ENCOUNTER — Encounter: Admit: 2024-05-18 | Discharge: 2024-05-18 | Payer: MEDICARE

## 2024-05-18 DIAGNOSIS — Z7901 Long term (current) use of anticoagulants: Secondary | ICD-10-CM

## 2024-05-18 DIAGNOSIS — I4891 Unspecified atrial fibrillation: Principal | ICD-10-CM

## 2024-05-18 LAB — PROTIME INR (PT): INR: 2.2

## 2024-05-31 ENCOUNTER — Encounter: Admit: 2024-05-31 | Discharge: 2024-05-31 | Payer: MEDICARE

## 2024-05-31 MED ORDER — HYDROCHLOROTHIAZIDE 25 MG PO TAB
25 mg | ORAL_TABLET | ORAL | 3 refills | 28.00000 days | Status: AC
Start: 2024-05-31 — End: ?

## 2024-05-31 MED ORDER — PRAVASTATIN 80 MG PO TAB
80 mg | ORAL_TABLET | Freq: Every day | ORAL | 3 refills | 90.00000 days | Status: AC
Start: 2024-05-31 — End: ?

## 2024-05-31 MED ORDER — LOSARTAN 50 MG PO TAB
50 mg | ORAL_TABLET | Freq: Every day | ORAL | 3 refills | 90.00000 days | Status: AC
Start: 2024-05-31 — End: ?

## 2024-06-16 ENCOUNTER — Encounter: Admit: 2024-06-16 | Discharge: 2024-06-16 | Payer: MEDICARE

## 2024-06-16 DIAGNOSIS — I4891 Unspecified atrial fibrillation: Principal | ICD-10-CM

## 2024-06-16 DIAGNOSIS — Z7901 Long term (current) use of anticoagulants: Secondary | ICD-10-CM

## 2024-06-16 LAB — PROTIME INR (PT): INR: 2 — ABNORMAL HIGH (ref 0.89–1.11)

## 2024-07-21 ENCOUNTER — Encounter: Admit: 2024-07-21 | Discharge: 2024-07-21 | Payer: MEDICARE

## 2024-07-21 DIAGNOSIS — Z7901 Long term (current) use of anticoagulants: Secondary | ICD-10-CM

## 2024-07-21 DIAGNOSIS — I4891 Unspecified atrial fibrillation: Principal | ICD-10-CM

## 2024-07-21 LAB — PROTIME INR (PT): INR: 2

## 2024-08-19 ENCOUNTER — Encounter: Admit: 2024-08-19 | Discharge: 2024-08-19 | Payer: MEDICARE

## 2024-08-19 MED ORDER — WARFARIN 2 MG PO TAB
ORAL_TABLET | ORAL | 3 refills | 90.00000 days | Status: AC
Start: 2024-08-19 — End: ?

## 2024-08-19 MED ORDER — EZETIMIBE 10 MG PO TAB
10 mg | ORAL_TABLET | Freq: Every day | ORAL | 3 refills | 90.00000 days | Status: AC
Start: 2024-08-19 — End: ?

## 2024-08-20 ENCOUNTER — Encounter: Admit: 2024-08-20 | Discharge: 2024-08-20 | Payer: MEDICARE

## 2024-08-20 DIAGNOSIS — I4891 Unspecified atrial fibrillation: Principal | ICD-10-CM

## 2024-08-20 DIAGNOSIS — Z7901 Long term (current) use of anticoagulants: Secondary | ICD-10-CM

## 2024-08-20 LAB — PROTIME INR (PT): INR: 2

## 2024-09-28 ENCOUNTER — Encounter: Admit: 2024-09-28 | Discharge: 2024-09-28 | Payer: MEDICARE

## 2024-09-28 DIAGNOSIS — I4891 Unspecified atrial fibrillation: Principal | ICD-10-CM

## 2024-09-28 DIAGNOSIS — Z7901 Long term (current) use of anticoagulants: Secondary | ICD-10-CM

## 2024-09-28 LAB — PROTIME INR (PT): INR: 1.7

## 2024-10-05 ENCOUNTER — Encounter: Admit: 2024-10-05 | Discharge: 2024-10-05 | Payer: MEDICARE

## 2024-10-05 DIAGNOSIS — Z7901 Long term (current) use of anticoagulants: Secondary | ICD-10-CM

## 2024-10-05 DIAGNOSIS — I4891 Unspecified atrial fibrillation: Principal | ICD-10-CM

## 2024-10-05 LAB — PROTIME INR (PT): INR: 2

## 2024-10-26 ENCOUNTER — Encounter: Admit: 2024-10-26 | Discharge: 2024-10-26 | Payer: MEDICARE

## 2024-11-17 ENCOUNTER — Encounter: Admit: 2024-11-17 | Discharge: 2024-11-17 | Payer: MEDICARE

## 2024-11-17 MED ORDER — PRAVASTATIN 80 MG PO TAB
80 mg | ORAL_TABLET | Freq: Every day | ORAL | 3 refills | 90.00000 days | Status: AC
Start: 2024-11-17 — End: ?

## 2024-11-17 MED ORDER — AMLODIPINE 5 MG PO TAB
5 mg | ORAL_TABLET | Freq: Every day | ORAL | 3 refills | 90.00000 days | Status: AC
Start: 2024-11-17 — End: ?

## 2024-11-17 MED ORDER — LOSARTAN 50 MG PO TAB
50 mg | ORAL_TABLET | Freq: Every day | ORAL | 3 refills | 30.00000 days | Status: AC
Start: 2024-11-17 — End: ?

## 2024-11-17 MED ORDER — METOPROLOL TARTRATE 50 MG PO TAB
50 mg | ORAL_TABLET | Freq: Two times a day (BID) | ORAL | 3 refills | 90.00000 days | Status: AC
Start: 2024-11-17 — End: ?

## 2024-11-18 ENCOUNTER — Encounter: Admit: 2024-11-18 | Discharge: 2024-11-18 | Payer: MEDICARE

## 2024-11-18 DIAGNOSIS — Z7901 Long term (current) use of anticoagulants: Secondary | ICD-10-CM

## 2024-11-18 DIAGNOSIS — I4891 Unspecified atrial fibrillation: Principal | ICD-10-CM

## 2024-11-18 LAB — PROTIME INR (PT): INR: 1.9

## 2024-11-18 NOTE — Progress Notes [1]
 11/18/2024 9:53 AM     Called and spoke to patient regarding INR of 1.9. Pt denies changes to diet or medications. Notes he has been sick. Is taking the occasional Ibuprofen, in fact, took some this morning. Discussed he has been on the lower end of his range the last few checks. We will increase to 3mg  3 days a week (T/Th/Sat) and 2 mg the other days and recheck in 2 weeks. Pt agreeable and verbalized understanding. Maintenance plan update in tracker.
